# Patient Record
Sex: Female | Born: 1950 | Race: Black or African American | Hispanic: No | Marital: Single | Smoking: Never smoker
Health system: Southern US, Community
[De-identification: ages and names within clinical notes are randomized; demographics above are authoritative.]

## PROBLEM LIST (undated history)

## (undated) DIAGNOSIS — E669 Obesity, unspecified: Secondary | ICD-10-CM

## (undated) DIAGNOSIS — C801 Malignant (primary) neoplasm, unspecified: Secondary | ICD-10-CM

## (undated) HISTORY — DX: Malignant (primary) neoplasm, unspecified: C80.1

## (undated) HISTORY — DX: Obesity, unspecified: E66.9

## (undated) HISTORY — PX: TOE SURGERY: SHX1073

---

## 1998-02-17 ENCOUNTER — Emergency Department (HOSPITAL_COMMUNITY): Admission: EM | Admit: 1998-02-17 | Discharge: 1998-02-17 | Payer: Self-pay | Admitting: Emergency Medicine

## 2010-10-01 ENCOUNTER — Inpatient Hospital Stay (HOSPITAL_COMMUNITY)
Admission: AD | Admit: 2010-10-01 | Discharge: 2010-10-01 | Payer: Self-pay | Source: Home / Self Care | Attending: Obstetrics and Gynecology | Admitting: Obstetrics and Gynecology

## 2010-10-01 LAB — CBC
HCT: 21.1 % — ABNORMAL LOW (ref 36.0–46.0)
Hemoglobin: 6.6 g/dL — CL (ref 12.0–15.0)
MCH: 26 pg (ref 26.0–34.0)
MCHC: 31.3 g/dL (ref 30.0–36.0)
MCV: 83.1 fL (ref 78.0–100.0)
RDW: 14.6 % (ref 11.5–15.5)

## 2010-10-06 ENCOUNTER — Other Ambulatory Visit: Payer: Self-pay | Admitting: Obstetrics & Gynecology

## 2010-10-06 ENCOUNTER — Other Ambulatory Visit (HOSPITAL_COMMUNITY): Admission: RE | Admit: 2010-10-06 | Payer: Self-pay | Source: Ambulatory Visit | Admitting: Obstetrics & Gynecology

## 2010-10-06 ENCOUNTER — Ambulatory Visit: Payer: Self-pay | Admitting: Obstetrics & Gynecology

## 2010-10-06 DIAGNOSIS — N950 Postmenopausal bleeding: Secondary | ICD-10-CM

## 2010-10-17 DIAGNOSIS — Z124 Encounter for screening for malignant neoplasm of cervix: Secondary | ICD-10-CM | POA: Insufficient documentation

## 2010-10-20 ENCOUNTER — Ambulatory Visit: Payer: Self-pay | Admitting: Obstetrics and Gynecology

## 2010-10-20 DIAGNOSIS — N924 Excessive bleeding in the premenopausal period: Secondary | ICD-10-CM

## 2010-10-27 ENCOUNTER — Ambulatory Visit: Payer: Self-pay | Admitting: Obstetrics and Gynecology

## 2010-10-27 DIAGNOSIS — N950 Postmenopausal bleeding: Secondary | ICD-10-CM

## 2010-11-11 ENCOUNTER — Ambulatory Visit: Payer: Self-pay | Admitting: Obstetrics and Gynecology

## 2010-11-18 ENCOUNTER — Ambulatory Visit: Payer: Self-pay | Admitting: Obstetrics and Gynecology

## 2010-11-18 ENCOUNTER — Other Ambulatory Visit (HOSPITAL_COMMUNITY): Admission: RE | Admit: 2010-11-18 | Payer: Self-pay | Source: Ambulatory Visit | Admitting: Obstetrics and Gynecology

## 2010-11-18 ENCOUNTER — Other Ambulatory Visit (HOSPITAL_COMMUNITY)
Admission: RE | Admit: 2010-11-18 | Discharge: 2010-11-18 | Disposition: A | Discharge status: Home / Self Care | Payer: Self-pay | Source: Ambulatory Visit | Attending: Obstetrics and Gynecology | Admitting: Obstetrics and Gynecology

## 2010-11-18 DIAGNOSIS — N950 Postmenopausal bleeding: Secondary | ICD-10-CM | POA: Insufficient documentation

## 2010-11-19 ENCOUNTER — Other Ambulatory Visit: Payer: Self-pay | Admitting: Obstetrics and Gynecology

## 2010-11-26 NOTE — Progress Notes (Unsigned)
NAMEHENRY, [name] NO.:  [id number]  MEDICAL RECORD NO.:  [id number]           PATIENT TYPE:  A  LOCATION:  [facility]                   FACILITY:  [facility]  PHYSICIAN:  Argentina Donovan, MD        DATE OF BIRTH:  [birth year]  DATE OF SERVICE:  10/20/2010                                 CLINIC NOTE  This patient was recently seen because of postmenopausal bleeding.  She is 60 year old African American female nulligravida.  She had been many years without a period and then started bleeding heavily.  She was in the emergency room and had a hemoglobin of 6.2.  She is on iron right now.  She said she felt no dizziness or fainting.  She came in and after was given a pill, 1 tablet of Cytotec.  The uterus is about 10 weeks size, shows a fibroid of 6 x 5 x 6.  The endometrial thickness was 3.7. Of note, the sampler was passed about 8 cm, tissue and blood was obtained.  However, the report showed only endocervical type tissues, no evidence of malignancy.  I am concerned because with a thickened endometrium like that, there was no sign of endometrial tissue, so the patient was then placed on Provera.  However, we are going to stop the Provera, come back in a week.  I am not exactly sure what is going to happen if she starts bleeding again.  We may have to proceed with another biopsy, maybe that there was so much bleeding that when they did the biopsy, they had nothing but blood and clots and not a good endometrial sample and a thickened endometrium may have been a clot.  It is difficult to say, but I am not comfortable with this lady.  Her sister was with her and her sister has had complex endometrial hyperplasia in the past so with that history, family history is the fact that she had some bleeding after many years of not bleeding or bleeding heavily at the age of 45.  I am very concerned about the endometrium.  IMPRESSION:  Postmenopausal bleeding, cause unknown.     ______________________________ Argentina Donovan, MD    PR/MEDQ  D:  10/20/2010  T:  10/21/2010  Job:  [id number]

## 2010-11-26 NOTE — Progress Notes (Unsigned)
NAMESKILER, TYE NO.:  [id number]  MEDICAL RECORD NO.:  [id number]           PATIENT TYPE:  A  LOCATION:  [facility]                   FACILITY:  [facility]  PHYSICIAN:  Argentina Donovan, MD        DATE OF BIRTH:  Dec 11, 1950  DATE OF SERVICE:  11/18/2010                                 CLINIC NOTE  The patient is a 60 year old African American female who on ultrasound had a thickened endometrium and has been bothered by postmenopausal bleeding.  The endometrium thickening is 3.7 cm.  They did a previous endometrial biopsy and that showed no evidence of malignancy, abundant inflammatory cells and blood, and endocervical-type tissue and said the tissue may be from the lower uterine segment, however, they said that the uterus was sounded to 12 cm, so we are repeating the biopsy in view of the fact that I repeated the [facility] and LH and they were in the well above postmenopausal levels.  She is in today.  I have grasped the anterior lip of the cervix which was the clean cervix, but spotting was still occurring with a single-tooth tenaculum.  I sounded the uterus to 12 cm and the biopsy pipette went into a full 12 cm.  Removed the pipette and got a good biopsy specimen, I think, and remove the tenaculum from the cervix.  I will call the patient with the results of this biopsy.  IMPRESSION:  Postmenopausal bleeding with thickened endometrium and postmenopausal hormone levels after a previous biopsy failed to reveal endometrial tissue.          ______________________________ Argentina Donovan, MD    PR/MEDQ  D:  11/18/2010  T:  11/19/2010  Job:  [id number]

## 2010-11-26 NOTE — Progress Notes (Signed)
NAMETUWANDA, VOKES NO.:  [id number]  MEDICAL RECORD NO.:  [id number]           PATIENT TYPE:  A  LOCATION:  [facility]                   FACILITY:  [facility]  PHYSICIAN:  Scheryl Darter, MD       DATE OF BIRTH:  09/01/1951  DATE OF SERVICE:                                 CLINIC NOTE  The patient comes today referred from [facility] due to postmenopausal bleeding.  The patient is a 60 year old black female gravida 0 whose last menstrual period before present episode was several years ago.  She began having bleeding on November 17 which was heavy and then lighter at times.  This continued throughout December.  She presented on January 27 to the [facility]U due to recent episode of heavy bleeding.  She had some slight dizziness but no orthostasis.  She was found to be anemic.  PAST MEDICAL HISTORY:  Unremarkable except for obesity.  PAST SURGICAL HISTORY:  Foot surgery.  SOCIAL HISTORY:  The patient denies smoking or drug abuse.  She occasionally drinks alcohol.  ALLERGIES:  CODEINE which causes nausea.  MEDICATIONS:  Vitamin C supplement 1000 mg a day and iron supplement 325 mg p.o. b.i.d.  REVIEW OF SYSTEMS:  Slight bleeding today.  No complaints of pain.  She had taken Cytotec last night for cervical biopsy and she had considerable amount of cramping and some nausea.  This has resolved.  PHYSICAL EXAMINATION:  GENERAL:  The patient is in no acute distress. She does not appear pale.  Her affect is normal except for slight anxiety. ABDOMEN:  Obese, soft, and nontender with no mass. EXTREMITIES:  Showed no swelling. PELVIC:  External genitalia:  Vagina and cervix showed some slight vaginal bleeding, but otherwise appeared normal.  Uterus is about 8-10 weeks size, nontender.  No adnexal masses.  Ultrasound done on January 27 showed uterus measuring 11.1 x 6.2 x 8.1 cm with fibroids measuring 6.2 x 5.5 and 6.1 x 6.4 cm.  Endometrial thickness was 3.7 cm.  The endometrium  appeared heterogeneous.  The patient gave consent today for endometrial biopsy.  I explained the procedure and risks of uterine damage, bleeding, infection, and pain.  Consent was signed and a time- out was performed.  Cervix was prepped with Betadine and grasped with single-tooth tenaculum.  Cervical dilators were used and uterus sounded to about 12 cm.  Milex sampler was passed to about 8 cm and tissue and blood was obtained.  She tolerated this well.  All instruments were removed.  IMPRESSION:  Postmenopausal bleeding and probable endometrial hyperplasia.  PLAN:  Gave prescription for Provera 10 mg p.o. daily.  She is to return to review her biopsy results in about 3 weeks.  Notify us if her bleeding worsens.  The patient was found to be anemic in MAU and hemoglobin was 6.6.  She should continue with her iron supplementation.     Scheryl Darter, MD    JA/MEDQ  D:  10/06/2010  T:  10/07/2010  Job:  [id number]

## 2010-11-26 NOTE — Progress Notes (Unsigned)
[name], CAPLEY NO.:  [id number]  MEDICAL RECORD NO.:  [id number]           PATIENT TYPE:  A  LOCATION:  [facility]                   FACILITY:  [facility]  PHYSICIAN:  Argentina Donovan, MD        DATE OF BIRTH:  1951-07-03  DATE OF SERVICE:  10/27/2010                                 CLINIC NOTE  The patient is a 60 year old African American, nulligravida female that I saw last week.  She has been bleeding, had a thickened endometrium, had an endometrial biopsy, which showed only endocervical type tissue with abundant inflammatory cells.  No evidence of malignancy seen.  The dictation said that they sounded the uterus to 12 cm.  The Mylex sampler was passed to about 8, where the samples were obtained.  That sounds as if it were well into the endometrial cavity; however, she had been on Provera to stop the bleeding and it did.  I told her when I saw her last week to stop the Provera.  She did and then 2-1/2 days later she started bleeding again.  I am holding off doing anything and see if this runs like a regular period.  I am going to get [facility] and LH levels on her as well as estradiol and she just may be the far end of the bell curve, but I am concerned because she had a sister with complex hyperplasia.  She will come back in a week or two and we will evaluate that after seeing the blood results and decide whether she needs another biopsy and I have told her to take some ibuprofen before coming in.          ______________________________ Argentina Donovan, MD    PR/MEDQ  D:  10/27/2010  T:  10/28/2010  Job:  [id number]

## 2010-12-09 ENCOUNTER — Ambulatory Visit: Payer: Self-pay | Attending: Gynecologic Oncology | Admitting: Gynecologic Oncology

## 2010-12-09 DIAGNOSIS — Z801 Family history of malignant neoplasm of trachea, bronchus and lung: Secondary | ICD-10-CM | POA: Insufficient documentation

## 2010-12-09 DIAGNOSIS — Z8049 Family history of malignant neoplasm of other genital organs: Secondary | ICD-10-CM | POA: Insufficient documentation

## 2010-12-09 DIAGNOSIS — E669 Obesity, unspecified: Secondary | ICD-10-CM | POA: Insufficient documentation

## 2010-12-09 DIAGNOSIS — C549 Malignant neoplasm of corpus uteri, unspecified: Secondary | ICD-10-CM | POA: Insufficient documentation

## 2010-12-10 ENCOUNTER — Other Ambulatory Visit: Payer: Self-pay | Admitting: Obstetrics and Gynecology

## 2010-12-10 DIAGNOSIS — Z1231 Encounter for screening mammogram for malignant neoplasm of breast: Secondary | ICD-10-CM

## 2010-12-14 NOTE — Consult Note (Signed)
NAMESUZZANE, [name] NO.:  [id number]  MEDICAL RECORD NO.:  [id number]           PATIENT TYPE:  LOCATION:                                 FACILITY:  PHYSICIAN:  Laurette Schimke, MD     DATE OF BIRTH:  Jun 09, 1951  DATE OF CONSULTATION:  12/09/2010 DATE OF DISCHARGE:                                CONSULTATION   Visit #[id number].  REFERRING PHYSICIAN:  Dr. Argentina Donovan  REASON FOR CONSULTATION:  Management of a grade 2 endometrial cancer.  HISTORY OF PRESENT ILLNESS:  This is a 60 year old nulliparous female, last normal menstrual period in her 71s.  In November 2011, the patient noted an onset of uterine bleeding, bleeding was continued and she was initially given a prescription for Provera which she took from February 22nd until March 8th.  An endometrial biopsy was attempted, but was unsuccessful because of cervical stenosis.  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  Uterine ultrasound was obtained and demonstrated endometrium of 3.7 cm.  Of note at the time of uterine sampling, the uterus was sounded to 12 cm.  [name] reports continued vaginal spotting.  On December 06, 2010, she noted heavier bleeding and then was normal.  PAST MEDICAL HISTORY:  Obesity.  PAST SURGICAL HISTORY:  Bilateral 5th toe surgery at the age of 13 years.  FAMILY HISTORY:  Notable for father with diabetes mellitus.  Her sister was diagnosed with lung cancer at the age of 45 and a sister was diagnosed with uterine cancer at the age of 103.  PAST GYNECOLOGIC HISTORY:  She is nulliparous, with regular menses. Last Pap 2 years ago within normal limits.  SOCIAL HISTORY:  Tobacco use, denies ever use.  Alcohol use, occasional. 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  ALLERGIES: 1. CODEINE causes emesis. 2. CYTOTEC causes severe emesis.  REVIEW OF  SYSTEMS:  No nausea, vomiting; reports no abdominal bloating. She is unaware of any weight changes.  There is no constipation or diarrhea.  There is continued vaginal spotting with intermittent episodes of heavier bleeding.  PHYSICAL EXAMINATION:  GENERAL:  Well-developed female, in no acute distress. VITAL SIGNS:  Weight 244 pounds, height 5 feet 7 inches, BMI 38.5, blood pressure 128/72, pulse of 72. HEART:  Regular rate and rhythm. CHEST:  Clear to auscultation. BACK:  No CVA tenderness. LYMPH NODE SURVEY:  No cervical, supraclavicular, inguinal adenopathy. ABDOMEN:  Soft, obese, nontender.  No palpable masses.  No palpable omental nodularity. PELVIC:  Normal external genitalia, Bartholin, urethra, and Skene. Blood noted within the vaginal vault, small cervix, very well supported, very wide uterine base approximately 8 cm. RECTAL:  Good anal sphincter tone without any masses.  IMPRESSION:  This is a 60 year old with a grade 2 endometrial adenocarcinoma and evidence of a uterus sounding to 12 cm by Dr. Okey Dupre. Options discussed with the patient of surgical were that of exploratory laparotomy, total abdominal hysterectomy, bilateral salpingo- oophorectomy, pelvic and periaortic lymph node dissection.  Risks and benefits  of the procedure were discussed with the patient and her sisters who were present, all of their questions were answered to their satisfaction.  [name] is aware that procedure will be performed by Dr. De Blanch on Jan 04, 2011.     Laurette Schimke, MD     WB/MEDQ  D:  12/09/2010  T:  12/10/2010  Job:  [id number]  cc:   Dr. Janan Ridge, R.N. [address] [place], [place] [address]  Electronically Signed by Laurette Schimke MD on 12/14/2010 07:07:16 AM

## 2010-12-21 ENCOUNTER — Ambulatory Visit (HOSPITAL_COMMUNITY)
Admission: RE | Admit: 2010-12-21 | Discharge: 2010-12-21 | Disposition: A | Discharge status: Home / Self Care | Payer: Self-pay | Source: Ambulatory Visit | Attending: Obstetrics and Gynecology | Admitting: Obstetrics and Gynecology

## 2010-12-21 DIAGNOSIS — Z1231 Encounter for screening mammogram for malignant neoplasm of breast: Secondary | ICD-10-CM | POA: Insufficient documentation

## 2010-12-31 ENCOUNTER — Other Ambulatory Visit: Payer: Self-pay | Admitting: Obstetrics & Gynecology

## 2010-12-31 ENCOUNTER — Other Ambulatory Visit: Payer: Self-pay | Admitting: Gynecology

## 2010-12-31 ENCOUNTER — Ambulatory Visit (HOSPITAL_COMMUNITY)
Admission: RE | Admit: 2010-12-31 | Discharge: 2010-12-31 | Disposition: A | Discharge status: Home / Self Care | Payer: Self-pay | Source: Ambulatory Visit | Attending: Obstetrics & Gynecology | Admitting: Obstetrics & Gynecology

## 2010-12-31 ENCOUNTER — Encounter (HOSPITAL_COMMUNITY): Payer: Self-pay

## 2010-12-31 DIAGNOSIS — C549 Malignant neoplasm of corpus uteri, unspecified: Secondary | ICD-10-CM | POA: Insufficient documentation

## 2010-12-31 DIAGNOSIS — Z01818 Encounter for other preprocedural examination: Secondary | ICD-10-CM | POA: Insufficient documentation

## 2010-12-31 DIAGNOSIS — R9431 Abnormal electrocardiogram [ECG] [EKG]: Secondary | ICD-10-CM | POA: Insufficient documentation

## 2010-12-31 DIAGNOSIS — Z01812 Encounter for preprocedural laboratory examination: Secondary | ICD-10-CM | POA: Insufficient documentation

## 2010-12-31 DIAGNOSIS — Z01810 Encounter for preprocedural cardiovascular examination: Secondary | ICD-10-CM | POA: Insufficient documentation

## 2010-12-31 LAB — COMPREHENSIVE METABOLIC PANEL
Albumin: 3.9 g/dL (ref 3.5–5.2)
Alkaline Phosphatase: 46 U/L (ref 39–117)
BUN: 11 mg/dL (ref 6–23)
Chloride: 112 mEq/L (ref 96–112)
Creatinine, Ser: 0.99 mg/dL (ref 0.4–1.2)
Glucose, Bld: 101 mg/dL — ABNORMAL HIGH (ref 70–99)
Potassium: 4.4 mEq/L (ref 3.5–5.1)
Total Bilirubin: 0.5 mg/dL (ref 0.3–1.2)
Total Protein: 7.6 g/dL (ref 6.0–8.3)

## 2010-12-31 LAB — CBC
MCH: 26.5 pg (ref 26.0–34.0)
MCHC: 32.3 g/dL (ref 30.0–36.0)
MCV: 82 fL (ref 78.0–100.0)
Platelets: 395 10*3/uL (ref 150–400)
RBC: 4.99 MIL/uL (ref 3.87–5.11)

## 2010-12-31 LAB — TYPE AND SCREEN
ABO/RH(D): A POS
Antibody Screen: NEGATIVE

## 2010-12-31 LAB — DIFFERENTIAL
Basophils Relative: 1 % (ref 0–1)
Eosinophils Absolute: 0.2 10*3/uL (ref 0.0–0.7)
Lymphs Abs: 2.2 10*3/uL (ref 0.7–4.0)
Monocytes Absolute: 0.6 10*3/uL (ref 0.1–1.0)
Monocytes Relative: 13 % — ABNORMAL HIGH (ref 3–12)

## 2010-12-31 LAB — ABO/RH: ABO/RH(D): A POS

## 2011-01-04 ENCOUNTER — Inpatient Hospital Stay (HOSPITAL_COMMUNITY)
Admission: RE | Admit: 2011-01-04 | Discharge: 2011-01-08 | DRG: 741 | Disposition: A | Discharge status: Home / Self Care | Payer: Self-pay | Source: Ambulatory Visit | Attending: Obstetrics & Gynecology | Admitting: Obstetrics & Gynecology

## 2011-01-04 ENCOUNTER — Other Ambulatory Visit: Payer: Self-pay | Admitting: Gynecology

## 2011-01-04 DIAGNOSIS — Z6837 Body mass index (BMI) 37.0-37.9, adult: Secondary | ICD-10-CM

## 2011-01-04 DIAGNOSIS — C801 Malignant (primary) neoplasm, unspecified: Secondary | ICD-10-CM

## 2011-01-04 DIAGNOSIS — R339 Retention of urine, unspecified: Secondary | ICD-10-CM | POA: Diagnosis not present

## 2011-01-04 DIAGNOSIS — C549 Malignant neoplasm of corpus uteri, unspecified: Principal | ICD-10-CM | POA: Diagnosis present

## 2011-01-04 HISTORY — PX: ABDOMINAL HYSTERECTOMY: SHX81

## 2011-01-04 HISTORY — DX: Malignant (primary) neoplasm, unspecified: C80.1

## 2011-01-04 NOTE — Op Note (Signed)
NAMELUMA, CLOPPER               ACCOUNT NO.:  [id number]  MEDICAL RECORD NO.:  [id number]           PATIENT TYPE:  I  LOCATION:  0010                         FACILITY:  [facility]  PHYSICIAN:  De Blanch, M.D.DATE OF BIRTH:  02-24-1951  DATE OF PROCEDURE: DATE OF DISCHARGE:                              OPERATIVE REPORT   PREOPERATIVE DIAGNOSIS:  Grade 2 endometrial adenocarcinoma.  POSTOPERATIVE DIAGNOSIS:  Grade 2 endometrial adenocarcinoma, uterine fibroids.  PROCEDURE:  Total abdominal hysterectomy, bilateral salpingo- oophorectomy, pelvic and paraaortic lymphadenectomy.  SURGEON:  De Blanch, M.D.  ASSISTANTS: 1. Antionette Char MD 2. Telford Nab, R.N.  ANESTHESIA:  General with orotracheal tube.  ESTIMATED BLOOD LOSS:  300 cc.  SURGICAL FINDINGS:  At the time of exploratory laparotomy, the upper abdomen was explored and showed no evidence of metastatic disease. Likewise the pelvic and paraaortic lymph nodes appeared grossly normal. The uterus was enlarged with approximately [redacted] weeks gestational size with multiple subserosal fibroids.  There was a prominent 6-cm paracervical fibroid on the right.  Frozen section revealed inner half myometrial invasion but the lesion measured approximately 5 cm in diameter.  DESCRIPTION OF PROCEDURE:  The patient was brought to the operating room and after satisfactory attainment of general anesthesia was placed in modified lithotomy position in [place] stirrups.  The anterior abdominal wall, perineum and vagina were prepped.  Foley catheter was inserted and the patient was draped.  The abdomen was entered through a midline incision which was extended above the umbilicus.  Once the abdomen was opened, pelvic washings were obtained and sent to cytopathology.  The upper abdomen and pelvis were explored with the above-noted findings.  A Bookwalter retractor was positioned and the small bowel packed out  of pelvis.  The uterus was grasped with long Kelly clamps.  Round ligaments were divided.  The retroperitoneal spaces opened, identifying the iliac vessels, ureter and ovarian vessels.  The ovarian vessels were skeletonized, clamped, cut free, tied and suture ligated.  The bladder flap was advanced through sharp and blunt dissection.  Care was taken to avoid injury to the uterine vessels on the right which were obscured by a large paracervical fibroid.  Once bladder flap was advanced, the left uterine vessels were skeletonized, clamped, cut and suture ligated.  The paracervical tissue was likewise clamped, cut and suture ligated.  Attention was then turned to the right side.  The ureterolysis was required to mobilize the ureter away from the broad ligament fibroid. Once the ureter was fully visualized, we dissected around the fibroid and lifted it up.  We then clamped the uterine vessels.  These were divided and suture ligated.  We proceeded down both sides of the cervix clamping the cardinal ligaments and then the vaginal angles were clamped, the vagina transected from its junction to the cervix.  The vaginal angles were reapproximated with figure-of-eight sutures of 0 Vicryl and the central portion of vagina likewise closed with figure-of- eight sutures of 0 Vicryl.  Attention was turned to performing pelvic lymphadenectomy.  The lymph nodes overlying the external iliac artery and vein, internal iliac artery and obturator fossa were excised.  This was performed on both sides of the pelvis.  Care was taken to avoid injury to the obturator and genitofemoral nerve and blood vessels.  The Bookwalter retractor was repositioned and the paraaortic region exposed.  An incision was made along the peritoneum overlying the right common iliac artery and along the aorta.  The right ureter was deflected laterally behind the retractor.  We dissected underneath the retroperitoneal portion of the  duodenum.  With this exposure, we then excised all lymph nodes overlying the aorta, vena cava and right common iliac artery.  Hemostasis was achieved with cautery and occasional hemoclip.  This area was found to be dry.  We then returned to the pelvis.  The pelvis was inspected and additional suture was taken in the vaginal angle to achieve hemostasis.  Pelvis was irrigated and found to be hemostatic.  Packs and retractors were removed.  The anterior abdominal wall was then closed in layers, the first being a running mass closure using #1 PDS.  A #19 Blake drain was placed above the fascia in order to reduce the risk of seroma formation.  This was exited through stab wound in left upper quadrant and secured to the skin with 3-0 nylon.  The subcutaneous tissue was reapproximated with interrupted sutures of 3-0 Vicryl and the skin was closed with skin staples.  A dressing was applied.  The patient was awakened from anesthesia and taken to the recovery room in satisfactory condition.  Sponge, needle and instrument counts were correct x2.     De Blanch, M.D.     DC/MEDQ  D:  01/04/2011  T:  01/04/2011  Job:  [id number]  cc:   Michele Mcalpine D. Okey Dupre, M.D.  Telford Nab, R.N. [address] [place], [place] [address]  Roseanna Rainbow, M.D. Fax: [phone]  Electronically Signed by De Blanch M.D. on 01/04/2011 03:55:10 PM

## 2011-01-05 LAB — CBC
HCT: 32.6 % — ABNORMAL LOW (ref 36.0–46.0)
MCHC: 31.3 g/dL (ref 30.0–36.0)
MCV: 83.8 fL (ref 78.0–100.0)
Platelets: 344 10*3/uL (ref 150–400)
RDW: 15.5 % (ref 11.5–15.5)

## 2011-01-05 LAB — BASIC METABOLIC PANEL
BUN: 9 mg/dL (ref 6–23)
Calcium: 8.3 mg/dL — ABNORMAL LOW (ref 8.4–10.5)
Creatinine, Ser: 0.81 mg/dL (ref 0.4–1.2)
GFR calc non Af Amer: >60 mL/min (ref >60)
Glucose, Bld: 133 mg/dL — ABNORMAL HIGH (ref 70–99)

## 2011-01-06 LAB — URINALYSIS, ROUTINE W REFLEX MICROSCOPIC
Bilirubin Urine: NEGATIVE
Ketones, ur: NEGATIVE mg/dL
Nitrite: NEGATIVE
Protein, ur: NEGATIVE mg/dL
Urobilinogen, UA: 0.2 mg/dL (ref 0.0–1.0)

## 2011-01-06 LAB — URINE MICROSCOPIC-ADD ON

## 2011-01-09 ENCOUNTER — Inpatient Hospital Stay (HOSPITAL_COMMUNITY): Payer: Self-pay

## 2011-01-09 ENCOUNTER — Inpatient Hospital Stay (HOSPITAL_COMMUNITY)
Admission: AD | Admit: 2011-01-09 | Discharge: 2011-01-13 | DRG: 394 | Disposition: A | Discharge status: Home / Self Care | Payer: Self-pay | Source: Ambulatory Visit | Attending: Obstetrics & Gynecology | Admitting: Obstetrics & Gynecology

## 2011-01-09 DIAGNOSIS — Y838 Other surgical procedures as the cause of abnormal reaction of the patient, or of later complication, without mention of misadventure at the time of the procedure: Secondary | ICD-10-CM | POA: Diagnosis present

## 2011-01-09 DIAGNOSIS — K560 Paralytic ileus: Secondary | ICD-10-CM | POA: Diagnosis present

## 2011-01-09 DIAGNOSIS — K929 Disease of digestive system, unspecified: Principal | ICD-10-CM | POA: Diagnosis present

## 2011-01-09 LAB — DIFFERENTIAL
Basophils Relative: 0 % (ref 0–1)
Eosinophils Relative: 1 % (ref 0–5)
Monocytes Absolute: 0.8 10*3/uL (ref 0.1–1.0)
Monocytes Relative: 11 % (ref 3–12)
Neutro Abs: 5.4 10*3/uL (ref 1.7–7.7)

## 2011-01-09 LAB — URINE CULTURE
Colony Count: >=100000
Culture  Setup Time: 201205040125
Special Requests: NEGATIVE

## 2011-01-09 LAB — COMPREHENSIVE METABOLIC PANEL
Albumin: 3 g/dL — ABNORMAL LOW (ref 3.5–5.2)
Alkaline Phosphatase: 87 U/L (ref 39–117)
BUN: 10 mg/dL (ref 6–23)
Chloride: 101 mEq/L (ref 96–112)
Creatinine, Ser: 0.84 mg/dL (ref 0.4–1.2)
Glucose, Bld: 121 mg/dL — ABNORMAL HIGH (ref 70–99)
Potassium: 3.9 mEq/L (ref 3.5–5.1)
Total Bilirubin: 0.6 mg/dL (ref 0.3–1.2)
Total Protein: 6.7 g/dL (ref 6.0–8.3)

## 2011-01-09 LAB — CBC
HCT: 33.8 % — ABNORMAL LOW (ref 36.0–46.0)
Hemoglobin: 10.9 g/dL — ABNORMAL LOW (ref 12.0–15.0)
MCH: 26.8 pg (ref 26.0–34.0)
MCHC: 32.2 g/dL (ref 30.0–36.0)
RDW: 15.5 % (ref 11.5–15.5)

## 2011-01-09 MED ORDER — IOHEXOL 300 MG/ML  SOLN
100.0000 mL | Freq: Once | INTRAMUSCULAR | Status: AC | PRN
  Administered 2011-01-09: 100 mL via INTRAVENOUS

## 2011-01-12 LAB — BASIC METABOLIC PANEL
Calcium: 8.9 mg/dL (ref 8.4–10.5)
GFR calc Af Amer: >60 mL/min (ref >60)
GFR calc non Af Amer: >60 mL/min (ref >60)
Glucose, Bld: 81 mg/dL (ref 70–99)
Potassium: 4.1 mEq/L (ref 3.5–5.1)
Sodium: 134 mEq/L — ABNORMAL LOW (ref 135–145)

## 2011-01-15 NOTE — Discharge Summary (Signed)
  [name], [name]               ACCOUNT NO.:  [id number]  MEDICAL RECORD NO.:  [id number]           PATIENT TYPE:  I  LOCATION:  1530                         FACILITY:  [facility]  PHYSICIAN:  Roseanna Rainbow, M.D.DATE OF BIRTH:  02/10/1951  DATE OF ADMISSION:  01/04/2011 DATE OF DISCHARGE:  01/08/2011                              DISCHARGE SUMMARY   CHIEF COMPLAINT:  The patient is a 60 year old who presents for operative management of a grade 2 endometrial cancer.  Please see the dictated history and physical per Dr. Laurette Schimke.  HOSPITAL COURSE:  The patient was admitted and underwent a total abdominal hysterectomy, bilateral salpingo-oophorectomy, pelvic and periaortic lymphadenectomy.  Please see the dictated operative summary for details.  The patient was started on Lovenox postoperatively for thromboembolic prophylaxis.  Her hemoglobin on postoperative day #1 was 10.2.  A basic metabolic profile was normal.  The patient's diet was gradually advanced.  On postoperative day #2, there was urinary retention versus mild dehydration, hypovolemia.  The patient increased her fluid intake and the Foley catheter was replaced for 24 hours.  A urinalysis was negative.  A urine culture and sensitivity was pending on the day of discharge.  The Foley catheter was again discontinued on postoperative day #3.  The patient voided without difficulty.  The JP drain that had been placed in the subcutaneous layer was removed on the day of discharge.  DISCHARGE DIAGNOSIS:  Endometrioid carcinoma FIGO grade 3, stage IA.  PROCEDURES:  Total abdominal hysterectomy, bilateral salpingo- oophorectomy, pelvic and periaortic lymphadenectomies.  CONDITION:  Stable.  DIET:  Regular.  ACTIVITY:  Pelvic rest, progressive activity.  MEDICATIONS:  Herbal liquid, ferrous sulfate, ibuprofen, Percocet 5/325 one to two tablets every 6 hours as needed, Lovenox 50 mg subcu q.24 h for an 18-day  course.  DISPOSITION:  The patient was to follow up in the GYN Oncology office for staple removal and routine postoperative followup.     Roseanna Rainbow, M.D.     LAJ/MEDQ  D:  01/08/2011  T:  01/08/2011  Job:  [id number]  cc:   Telford Nab, R.N. [address] [place], [place] [address]  Dr. Argentina Donovan  Electronically Signed by Antionette Char M.D. on 01/15/2011 12:17:09 PM

## 2011-02-02 ENCOUNTER — Ambulatory Visit: Payer: Self-pay | Attending: Gynecologic Oncology | Admitting: Gynecologic Oncology

## 2011-02-02 DIAGNOSIS — R209 Unspecified disturbances of skin sensation: Secondary | ICD-10-CM | POA: Insufficient documentation

## 2011-02-02 DIAGNOSIS — Z9079 Acquired absence of other genital organ(s): Secondary | ICD-10-CM | POA: Insufficient documentation

## 2011-02-02 DIAGNOSIS — R110 Nausea: Secondary | ICD-10-CM | POA: Insufficient documentation

## 2011-02-02 DIAGNOSIS — Z90710 Acquired absence of both cervix and uterus: Secondary | ICD-10-CM | POA: Insufficient documentation

## 2011-02-02 DIAGNOSIS — C549 Malignant neoplasm of corpus uteri, unspecified: Secondary | ICD-10-CM | POA: Insufficient documentation

## 2011-02-03 NOTE — Consult Note (Signed)
NAMEZOOEY, SCHREURS               ACCOUNT NO.:  [id number]  MEDICAL RECORD NO.:  [id number]           PATIENT TYPE:  O  LOCATION:  GYN                          FACILITY:  [facility]  PHYSICIAN:  Gwynneth Fabio A. Duard Brady, MD    DATE OF BIRTH:  August 03, 1951  DATE OF CONSULTATION:  02/02/2011 DATE OF DISCHARGE:                                CONSULTATION   HISTORY OF PRESENT ILLNESS:  Ms. Cuppett is a very pleasant 60 year old who was seen by Dr. Nelly Rout who was diagnosed with a grade 2 endometrioid adenocarcinoma.  Subsequently, on Jan 04, 2011, she underwent total abdominal hysterectomy, bilateral salpingo-oophorectomy, pelvic and paraaortic lymph node dissection.  Surgical findings revealed the uterus to be enlarged at approximately 14 weeks' size with multiple subserosal fibroids.  There was a prominent 6 cm paracervical fibroid on the right side.  Frozen section revealed inner half myometrial invasion, but the lesion measured 5 cm in diameter.  Final pathology revealed a grade 3 endometrioid adenocarcinoma confined within the inner half of the myometrium.  There was adenomyosis within the uterus.  The bilateral adnexa were negative.  0 out of 8 left pelvic lymph nodes, 0 out of 14 right pelvic lymph nodes, and 0 out of 7 right paraaortic lymph nodes were involved for a total of 0 out of 29.  The depth of invasion was 0.2 cm, the myometrium was 2.6 cm in thickness.  The tumor size did measure 5 cm.  Based on this pathology report, she is IA grade 3 and does not qualifies high-intermediate risk, she only has grade 3 lesion as her risk factor.  She has minimal myometrial invasion, no lymphovascular space involvement with an age of 60.  She comes in today for postoperative check.  She was admitted postoperatively for a couple days for an ileus versus partial small bowel obstruction.  She does not recall how many days she was admitted for.  She was admitted to [facility] and was under the care of  Dr. Tamela Oddi, she recovered fine since that time.  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  She does complain of occasional right lower quadrant discomfort when she is getting up.  She is walking every morning, she denies any vaginal bleeding, any change in bowel or bladder habits.  She states she has only taken 6 out of the 30 pain pills that were prescribed to her, but she did take one last night, anxiety in anticipation of today's visit.  PHYSICAL EXAMINATION:  VITAL SIGNS:  Weight 228 pounds, which is down 16 pounds from April 5th.  Blood pressure 116/70, pulse 68, respirations 18, temperature 97.7. GENERAL:  A well-nourished, well-developed female, in no acute distress. ABDOMEN:  A well-healed vertical midline incision.  Abdomen is soft and nontender. PELVIC:  External genitalia is within normal limits, though atrophic. The vagina is without any lesions.  The vaginal cuff is visualized.  It is healing well.  Bimanual examination reveals no masses or tenderness.  ASSESSMENT:  A 60 year old with stage IA, grade 3, endometrioid adenocarcinoma who is doing well from a postoperative standpoint.  She does not require any adjuvant therapy.  She and her sisters are very pleased regarding this.  We had a very lengthy discussion regarding the numbness on the right anterior thigh in that I feel this is consistent with genitofemoral as she does not have any motor deficits.  I discussed with her that this is self-limited, will improve with time.  She is doing very well with a walking regimen and is going upstairs.  She also complained of having some nausea when she smells very strong smelling foods that are being prepared in her home, this is most likely as she had had episodes of nausea at the time of her partial small bowel obstruction versus ileus and she is associated  certain smells with that. I have encouraged her to not let that become a pattern and to either cook foods in a different way, so that the odors are not as bad or to open windows or increase ventilation if at all possible.  She will return to see [place] in 3 months for an interval visit and they will begin alternating visits every 4 months with Dr. Okey Dupre.  Her questions were elicited and answered to her satisfaction.  We spent a total of 20 minutes face-to-face time discussing her many questions.     Khandi Kernes A. Duard Brady, MD     PAG/MEDQ  D:  02/02/2011  T:  02/03/2011  Job:  [id number]  cc:   Javier Glazier. Okey Dupre, M.D.  Telford Nab, R.N. [address] [place], [place] [address]  Electronically Signed by Cleda Mccreedy MD on 02/03/2011 09:38:00 AM

## 2011-02-10 ENCOUNTER — Ambulatory Visit: Payer: Self-pay | Attending: Gynecologic Oncology | Admitting: Gynecologic Oncology

## 2011-02-10 DIAGNOSIS — Z90710 Acquired absence of both cervix and uterus: Secondary | ICD-10-CM | POA: Insufficient documentation

## 2011-02-10 DIAGNOSIS — Z9079 Acquired absence of other genital organ(s): Secondary | ICD-10-CM | POA: Insufficient documentation

## 2011-02-10 DIAGNOSIS — R1110 Vomiting, unspecified: Secondary | ICD-10-CM | POA: Insufficient documentation

## 2011-02-10 DIAGNOSIS — N898 Other specified noninflammatory disorders of vagina: Secondary | ICD-10-CM | POA: Insufficient documentation

## 2011-02-10 DIAGNOSIS — C549 Malignant neoplasm of corpus uteri, unspecified: Secondary | ICD-10-CM | POA: Insufficient documentation

## 2011-02-11 NOTE — Consult Note (Signed)
NAMESOLVEIG, FANGMAN NO.:  [id number]  MEDICAL RECORD NO.:  [id number]  LOCATION:  GYN                          FACILITY:  [facility]  PHYSICIAN:  Laurette Schimke, MD     DATE OF BIRTH:  1951-07-25  DATE OF CONSULTATION: DATE OF DISCHARGE:                                CONSULTATION   REASON FOR VISIT:  Vaginal discharge, status post endometrial cancer staging.  HISTORY OF PRESENT ILLNESS:  This is a 60 year old, diagnosed with a grade 2 endometrial carcinoma by Dr. Argentina Donovan.  On Jan 04, 2011, she underwent total abdominal hysterectomy, bilateral salpingo-oophorectomy, pelvic and periaortic lymph node dissection.  Surgical findings noted the uterus, measuring approximately 14 cm with multiple subserosal fibroids.  There was a 6 cm paracervical fibroid on the right.  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  There is no lymphovascular space involvement or no cervical involvement.  The depth of invasion was 2 mm where the myometrium was 26 mm in thickness.  Postoperatively, she required readmission for an ileus versus partial bowel obstruction, and also had complaints of right lower discomfort.  Ms. Wirtanen reports that 2 days ago on February 09, 2011, she noticed leakage of clear liquids, which had the odor of that of urine.  She states that the leakage is not continuous, but almost in gushes.  She reports having a large bladder void in the morning.  She denies any recent episodes of significant coughing.  She denies leakage with coughing or sneezing. She also denies any recent heavy lifting, abdominal strain, and she reports that she has had pelvic rest.  PHYSICAL EXAMINATION:  VITAL SIGNS:  Blood pressure 120/68, pulse of 64, temperature 98.2. GENERAL:  A well-developed female in no acute distress. ABDOMEN:  A  well-healed midline vertical incision.  Abdomen is soft, nontender.  No evidence of hernia. PELVIC:  Fluid was appreciated within the vagina without pooling.  No vaginal discharge.  Vaginal cuff appeared intact.  There is no evidence of bowel within the vault or the vaginal apex. RECTAL:  Good anal sphincter tone without any masses.  IMPRESSION:  This is a 60 year old with a stage IA, grade 3, endometrioid adenocarcinoma with clear vaginal discharge.  A tampon test was done on this patient.  Tampon was inserted into the vagina and methylene blue instilled into the bladder.  The patient sat, ambulated without staining of the tampon.  She was then instructed to void and a dose of Pyridium was given.  The patient then subsequently voided orange urine and there still with no evidence of pooling of orange material within the vagina and the tampon was without any evidence of either orange or blue staining.  Of note, the tampon was soaked with a serum colored liquid.  It is my suspicion that this is peritoneal fluid, I am unclear whether she had a superior vaginal seroma that is now leaking or whether this represents disruption of the vaginal repair with subsequent leakage of peritoneal fluid.  I have instructed the patient to continue pelvic rest.  No  heavy lifting and I have advised her that the symptoms should resolve.  If there is increase in leakage of urine or she has been instructed if there is a feeling of fullness or mass within the vagina, then she should present to [facility].  She has also been advised to call for bleeding, increasing discharge, or other concerns.  She will follow up in 4 days with a phone call for a checkup and will be reassessed at that point.  Otherwise, the plan is for her to be seen in her 6-week visit. The patient and her sisters clearly understood the instructions.     Laurette Schimke, MD     WB/MEDQ  D:  02/10/2011  T:  02/11/2011  Job:   [id number]  cc:   Elinor Dodge, MD   Telford Nab, R.N. [address] [place], [place] [address]  Electronically Signed by Laurette Schimke MD on 02/11/2011 10:21:12 AM

## 2011-02-18 NOTE — Discharge Summary (Signed)
  [name], [name] NO.:  [id number]  MEDICAL RECORD NO.:  [id number]  LOCATION:  GYN                          FACILITY:  [facility]  PHYSICIAN:  Roseanna Rainbow, M.D.DATE OF BIRTH:  [birth year]  DATE OF ADMISSION:  02/10/2011 DATE OF DISCHARGE:                              DISCHARGE SUMMARY   CHIEF COMPLAINT:  The patient is a 60 year old, status post a total abdominal hysterectomy and bilateral salpingo-oophorectomy on May 1 for an endometrial cancer, complaining of vomiting.  HISTORY OF PRESENT ILLNESS:  The patient reports the above complaints. She denies the use of bowel movement and has not passed flatus since being discharged from the hospital.  SOCIAL HISTORY:  No tobacco, ethanol or drug use.  ALLERGIES:  No known drug allergies.  MEDICATIONS:  Please see the medication reconciliation form.  REVIEW OF SYSTEMS:  GI:  Please see the above.  She denies severe focal abdominal pain.  GENERAL:  She denies any fever.  PHYSICAL EXAMINATION:  VITAL SIGNS:  Stable, afebrile. GENERAL:  No apparent distress. ABDOMEN:  Mildly distended, hypoactive bowel sounds throughout and nontender.  ASSESSMENT AND PLAN:  Likely postoperative ileus.  KUB and upright, serial labs, serial exams and bowel rest.    HOSPITAL COURSE:  The patient was admitted and and an NG tube was placed.  The patient remained afebrile on Jan 12, 2011.  The patient reported flatus and the NG tube was clamped.  The patient tolerated clamping of the NG tube.  The NG tube was then removed.  The patient was started on clear liquid diet.  An H2 blocker was started as well.  A white blood cell count was 7.4 on Jan 09, 2011, hemoglobin was 10.9 and 33.8.  Basic metabolic profile was normal.  On the two-view abdomen on Jan 09, 2011, favored in early of partial small-bowel obstruction.  A subsequent CT scan dated the same day showed a small-bowel obstruction localized to the right lower quadrant  ileal loop.  No evidence of abscess and postoperative changes.  The patient was tolerating a transitional diet on the day of discharge.  DISCHARGE DIAGNOSIS:  Postoperative ileus versus early partial small- bowel obstruction.  CONDITION:  Stable.  DIET:  Regular diet.  DISCHARGE MEDICATIONS: 1. Percocet 5/325 1-2 tabs as needed. 2. Lovenox 60 mg daily. 3. Vitamin C. 4. Ibuprofen p.r.n. 5. Ferrous sulfate 2 tabs daily.  DISPOSITION:  The patient was to follow up in the GYN Oncology office.  ACTIVITY:  Ad lib.     Roseanna Rainbow, M.D.     LAJ/MEDQ  D:  02/14/2011  T:  02/15/2011  Job:  [id number]  cc:   Telford Nab, R.N. [address] [place], [place] [address]  Electronically Signed by Antionette Char M.D. on 02/18/2011 04:59:12 PM

## 2011-03-11 ENCOUNTER — Ambulatory Visit: Payer: Self-pay | Attending: Gynecology | Admitting: Gynecology

## 2011-03-11 DIAGNOSIS — C549 Malignant neoplasm of corpus uteri, unspecified: Secondary | ICD-10-CM | POA: Insufficient documentation

## 2011-03-11 DIAGNOSIS — R209 Unspecified disturbances of skin sensation: Secondary | ICD-10-CM | POA: Insufficient documentation

## 2011-03-15 NOTE — Consult Note (Signed)
  [name], CUADRA NO.:  [id number]  MEDICAL RECORD NO.:  [id number]  LOCATION:  GYN                          FACILITY:  [facility]  PHYSICIAN:  De Blanch, M.D.DATE OF BIRTH:  05/29/1951  DATE OF CONSULTATION:  03/11/2011 DATE OF DISCHARGE:                                CONSULTATION   CHIEF COMPLAINT:  Endometrial cancer.  INTERVAL HISTORY:  The patient returns today for continuing followup. She was last seen on June 7 at which time she had a vaginal discharge. The discharge has completely resolved as of February 28, 2011, according to the patient.  She denies any vaginal bleeding.  She denies any pelvic pain, pressure or any other GI or GU symptoms.  She does note some loss of sensation in her right anterior thigh, although she has full range of motion and full function.  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  HISTORY OF PRESENT ILLNESS:  The patient underwent a TAH-BSO, pelvic and paraaortic lymphadenectomy on Jan 04, 2011.  Final pathology showed a grade 3 endometrial cancer with superficial myometrial invasion (stage IA).  No adjuvant therapy has been recommended.  Overall, the patient is doing quite well and has no complaints.  She is eager to return to work full-time.  PAST MEDICAL HISTORY:  Medical illnesses, obesity.  PAST SURGICAL HISTORY:  Toe surgery, TAH-BSO, pelvic and paraaortic lymphadenectomy.  PAST GYNECOLOGIC HISTORY:  Gravida 0.  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  She does not smoke.  DRUG ALLERGIES:  CODEINE (nausea), CYTOTEC (nausea).  FAMILY HISTORY:  The patient has a sister who has endometrial cancer. There is no other breast or colon cancer in the family history.  REVIEW OF SYSTEMS:  Ten-point comprehensive review of systems negative except as noted above.  PHYSICAL EXAM:  VITAL SIGNS:  Weight 228 pounds, blood pressure 130/70. GENERAL:  The patient is  a healthy African-American female, in no acute distress. HEENT:  Negative. NECK:  Supple without thyromegaly.  There is no supraclavicular or inguinal adenopathy. ABDOMEN:  Soft, nontender.  No mass, organomegaly, ascites or hernias noted.  Midline incision is healing well. PELVIC EXAM:  EGBUS, vagina, bladder, urethra are normal.  Cervix and uterus surgically absent.  Vaginal cuff is well healed.  No lesions are noted.  Bimanual, rectovaginal exam revealed no mass, induration or nodularity.  IMPRESSION:  Stage IA grade 3 endometrial cancer, doing well.  The patient given the okay to return to full levels of activity.  She will return to see Dr. Okey Dupre in 3 months for surveillance and we will plan on seeing her back in 6 months.     De Blanch, M.D.     DC/MEDQ  D:  03/11/2011  T:  03/11/2011  Job:  [id number]  cc:   Michele Mcalpine D. Okey Dupre, M.D.  Telford Nab, R.N. [address] [place], [place] [address]  Electronically Signed by De Blanch M.D. on 03/15/2011 02:06:40 PM

## 2011-04-22 ENCOUNTER — Ambulatory Visit: Payer: Self-pay | Attending: Gynecology | Admitting: Gynecology

## 2011-04-22 DIAGNOSIS — R209 Unspecified disturbances of skin sensation: Secondary | ICD-10-CM | POA: Insufficient documentation

## 2011-04-22 DIAGNOSIS — R29898 Other symptoms and signs involving the musculoskeletal system: Secondary | ICD-10-CM | POA: Insufficient documentation

## 2011-04-25 NOTE — Consult Note (Signed)
  NAMEBAYA, LENTZ NO.:  [id number]  MEDICAL RECORD NO.:  [id number]  LOCATION:  GYN                          FACILITY:  [facility]  PHYSICIAN:  De Blanch, M.D.DATE OF BIRTH:  10-29-1950  DATE OF CONSULTATION:  04/22/2011 DATE OF DISCHARGE:                                CONSULTATION   CHIEF COMPLAINT:  Weakness and numbness in her left thigh.  The patient returns today to further discuss the numbness in her right thigh.  This was previously brought to my attention and it was my impression at that time that it was a genitofemoral nerve injury from a lymph node dissection.  However, the patient tells me that she is unable to lift her leg from the gas pedal to the brake pedal when she drives her car. She says that her left leg is weaker; although, she did not tell me this at our last visit.  PHYSICAL EXAMINATION:  VITAL SIGNS:  Weight 226 pounds, blood pressure 120/78. GENERAL:  The patient is a healthy, alert, African American female. EXTREMITIES:  Examination of her legs reveals that she does have quadriceps function in her right leg, but that it is diminished compared to the left leg.  She also notes some numbness in the anterior aspect of the right leg.  She is able to dorsiflex and does not have footdrop.  IMPRESSION:  Most likely a transient femoral nerve injury, possibly secondary to retractor placement.  The patient does have quadriceps function and I think this is a partial injury which should resolve over time.  In the interim, I think it is important for her to enter a physical therapy program in order to keep as much strength and range of motion in the leg as possible.  For the time being, she will not drive until she can do this safely.  I will plan on seeing the patient back again in 2 months for reevaluation or as needed.     De Blanch, M.D.     DC/MEDQ  D:  04/22/2011  T:  04/23/2011  Job:  [id number]  cc:   Telford Nab, R.N. [address] [place], [place] [address]  Javier Glazier. Okey Dupre, M.D.  Electronically Signed by De Blanch M.D. on 04/25/2011 11:40:26 AM

## 2011-04-26 ENCOUNTER — Ambulatory Visit: Payer: Self-pay | Attending: Gynecology | Admitting: Physical Therapy

## 2011-04-26 DIAGNOSIS — M6281 Muscle weakness (generalized): Secondary | ICD-10-CM | POA: Insufficient documentation

## 2011-04-26 DIAGNOSIS — IMO0001 Reserved for inherently not codable concepts without codable children: Secondary | ICD-10-CM | POA: Insufficient documentation

## 2011-04-27 ENCOUNTER — Ambulatory Visit: Payer: Self-pay

## 2011-04-27 ENCOUNTER — Ambulatory Visit: Payer: Self-pay | Admitting: Gynecologic Oncology

## 2011-05-02 ENCOUNTER — Ambulatory Visit: Payer: Self-pay | Admitting: Physical Therapy

## 2011-05-04 ENCOUNTER — Encounter: Payer: Self-pay | Admitting: Physical Therapy

## 2011-05-04 ENCOUNTER — Ambulatory Visit: Payer: Self-pay | Admitting: Physical Therapy

## 2011-05-11 ENCOUNTER — Ambulatory Visit: Payer: Self-pay | Attending: Gynecology | Admitting: Physical Therapy

## 2011-05-11 DIAGNOSIS — M6281 Muscle weakness (generalized): Secondary | ICD-10-CM | POA: Insufficient documentation

## 2011-05-11 DIAGNOSIS — IMO0001 Reserved for inherently not codable concepts without codable children: Secondary | ICD-10-CM | POA: Insufficient documentation

## 2011-05-16 ENCOUNTER — Ambulatory Visit: Payer: Self-pay | Admitting: Physical Therapy

## 2011-05-18 ENCOUNTER — Ambulatory Visit: Payer: Self-pay | Admitting: Physical Therapy

## 2011-05-23 ENCOUNTER — Ambulatory Visit: Payer: Self-pay | Admitting: Physical Therapy

## 2011-05-26 ENCOUNTER — Encounter: Payer: Self-pay | Admitting: Physical Therapy

## 2011-05-26 ENCOUNTER — Ambulatory Visit: Payer: Self-pay | Admitting: Rehabilitative and Restorative Service Providers"

## 2011-05-30 ENCOUNTER — Ambulatory Visit: Payer: Self-pay | Admitting: Physical Therapy

## 2011-06-01 ENCOUNTER — Ambulatory Visit: Payer: Self-pay | Admitting: Physical Therapy

## 2011-06-06 ENCOUNTER — Ambulatory Visit: Payer: Self-pay | Attending: Gynecology | Admitting: Physical Therapy

## 2011-06-06 DIAGNOSIS — M6281 Muscle weakness (generalized): Secondary | ICD-10-CM | POA: Insufficient documentation

## 2011-06-06 DIAGNOSIS — IMO0001 Reserved for inherently not codable concepts without codable children: Secondary | ICD-10-CM | POA: Insufficient documentation

## 2011-06-10 ENCOUNTER — Ambulatory Visit: Payer: Self-pay | Attending: Gynecology | Admitting: Gynecology

## 2011-06-10 ENCOUNTER — Other Ambulatory Visit (HOSPITAL_COMMUNITY)
Admission: RE | Admit: 2011-06-10 | Discharge: 2011-06-10 | Disposition: A | Discharge status: Home / Self Care | Payer: Self-pay | Source: Ambulatory Visit | Attending: Gynecology | Admitting: Gynecology

## 2011-06-10 ENCOUNTER — Other Ambulatory Visit: Payer: Self-pay | Admitting: Gynecology

## 2011-06-10 DIAGNOSIS — C549 Malignant neoplasm of corpus uteri, unspecified: Secondary | ICD-10-CM | POA: Insufficient documentation

## 2011-06-10 DIAGNOSIS — R29898 Other symptoms and signs involving the musculoskeletal system: Secondary | ICD-10-CM | POA: Insufficient documentation

## 2011-06-10 DIAGNOSIS — Z8540 Personal history of malignant neoplasm of unspecified female genital organ: Secondary | ICD-10-CM | POA: Insufficient documentation

## 2011-06-10 DIAGNOSIS — G5720 Lesion of femoral nerve, unspecified lower limb: Secondary | ICD-10-CM | POA: Insufficient documentation

## 2011-06-13 ENCOUNTER — Ambulatory Visit: Payer: Self-pay | Admitting: Physical Therapy

## 2011-06-13 NOTE — Consult Note (Signed)
  NAMERYANNE, MORAND NO.:  [id number]  MEDICAL RECORD NO.:  [id number]  LOCATION:  GYN                          FACILITY:  [facility]  PHYSICIAN:  De Blanch, M.D.DATE OF BIRTH:  [birth year]  DATE OF CONSULTATION:  06/10/2011 DATE OF DISCHARGE:                                CONSULTATION   CHIEF COMPLAINT:  Endometrial cancer, weakness of right leg.  INTERVAL HISTORY:  The patient returns today for a routine surveillance test, previously scheduled.  From a gynecologic point of view, she has no GI or GU symptoms.  Has no pelvic pain, pressure, vaginal bleeding, or discharge.  The patient is currently undergoing physical therapy twice a week and is noting continued improvement in the strength and function of her right leg.  She still feels that she is not strong enough to be able to lift her leg on to the brake pedal.  She is able to walk and takes a bus to and from her activities.  HISTORY OF PRESENT ILLNESS:  The patient underwent TAH-BSO, pelvic and periaortic lymphadenectomy Jan 04, 2011.  Final pathology showed a grade 3 endometrial cancer with superficial myometrial invasion (stage IA grade 3).  PAST MEDICAL HISTORY:  MEDICAL ILLNESSES:  Obesity.  PAST SURGICAL HISTORY:  The patient had surgery on her toe, TAH-BSO, pelvic and periaortic lymphadenectomy.  PAST GYNECOLOGIC HISTORY:  Gravida 0.  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  DRUG ALLERGIES:  CODEINE and CYTOTEC.  FAMILY HISTORY:  A sister with endometrial cancer.  REVIEW OF SYSTEMS:  10-point comprehensive review of systems negative except as noted above.  PHYSICAL EXAMINATION:  VITAL SIGNS:  Weight 228 pounds, blood pressure 130/80, pulse 70, respiratory rate 18. GENERAL:  The patient is a healthy African American female.  No acute distress. HEENT:  Negative. NECK:  Supple without thyromegaly.  There is no  supraclavicular or inguinal adenopathy. ABDOMEN:  Obese, soft, nontender.  No mass, organomegaly, ascites or hernias noted.  Midline incision is well-healed. PELVIC:  EG/BUS, vagina, bladder, urethra are normal.  Cervix and uterus surgically absent.  Cuff is well-supported.  No lesions are noted. Bimanual and rectovaginal exam revealed no masses, induration, or nodularity. EXTREMITIES:  Lower extremities are without edema or varicosities and grossly appear to have good strength in both legs.  IMPRESSION:  Stage IA, grade 3 endometrial cancer, clinically free of disease.  Pap smears were obtained.  Apparently femoral nerve compression injury, which is improving with physical therapy.  PLAN:  We will have the patient return in 3 months for continuing surveillance and she will continue physical therapy.     De Blanch, M.D.     DC/MEDQ  D:  06/10/2011  T:  06/10/2011  Job:  [id number]  cc:   Michele Mcalpine D. Okey Dupre, M.D.  Telford Nab, R.N. [address] [place], [place] [address]  Electronically Signed by De Blanch M.D. on 06/13/2011 09:17:26 AM

## 2011-06-15 ENCOUNTER — Ambulatory Visit: Payer: Self-pay | Admitting: Physical Therapy

## 2011-06-20 ENCOUNTER — Ambulatory Visit: Payer: Self-pay | Admitting: Physical Therapy

## 2011-06-22 ENCOUNTER — Encounter: Payer: Self-pay | Admitting: Physical Therapy

## 2011-06-27 ENCOUNTER — Encounter: Payer: Self-pay | Admitting: Physical Therapy

## 2011-06-29 ENCOUNTER — Encounter: Payer: Self-pay | Admitting: Rehabilitative and Restorative Service Providers"

## 2011-07-04 ENCOUNTER — Encounter: Payer: Self-pay | Admitting: Physical Therapy

## 2011-07-06 ENCOUNTER — Encounter: Payer: Self-pay | Admitting: Physical Therapy

## 2011-09-07 ENCOUNTER — Encounter: Payer: Self-pay | Admitting: *Deleted

## 2011-09-07 DIAGNOSIS — C541 Malignant neoplasm of endometrium: Secondary | ICD-10-CM

## 2011-09-09 ENCOUNTER — Ambulatory Visit: Payer: Self-pay | Attending: Gynecology | Admitting: Gynecology

## 2011-09-09 ENCOUNTER — Encounter: Payer: Self-pay | Admitting: Gynecology

## 2011-09-09 ENCOUNTER — Other Ambulatory Visit (HOSPITAL_COMMUNITY)
Admission: RE | Admit: 2011-09-09 | Discharge: 2011-09-09 | Disposition: A | Discharge status: Home / Self Care | Payer: Self-pay | Source: Ambulatory Visit | Attending: Gynecology | Admitting: Gynecology

## 2011-09-09 VITALS — BP 138/62 | HR 80 | Resp 16 | Ht 67.0 in | Wt 238.0 lb

## 2011-09-09 DIAGNOSIS — Z9079 Acquired absence of other genital organ(s): Secondary | ICD-10-CM | POA: Insufficient documentation

## 2011-09-09 DIAGNOSIS — Z90710 Acquired absence of both cervix and uterus: Secondary | ICD-10-CM | POA: Insufficient documentation

## 2011-09-09 DIAGNOSIS — Z01419 Encounter for gynecological examination (general) (routine) without abnormal findings: Secondary | ICD-10-CM | POA: Insufficient documentation

## 2011-09-09 DIAGNOSIS — C541 Malignant neoplasm of endometrium: Secondary | ICD-10-CM

## 2011-09-09 DIAGNOSIS — Z8049 Family history of malignant neoplasm of other genital organs: Secondary | ICD-10-CM | POA: Insufficient documentation

## 2011-09-09 DIAGNOSIS — Z801 Family history of malignant neoplasm of trachea, bronchus and lung: Secondary | ICD-10-CM | POA: Insufficient documentation

## 2011-09-09 DIAGNOSIS — C549 Malignant neoplasm of corpus uteri, unspecified: Secondary | ICD-10-CM | POA: Insufficient documentation

## 2011-09-09 DIAGNOSIS — E669 Obesity, unspecified: Secondary | ICD-10-CM | POA: Insufficient documentation

## 2011-09-09 NOTE — Progress Notes (Signed)
Addended by: Randel Pigg on: 09/09/2011 08:29 AM   Modules accepted: Orders

## 2011-09-09 NOTE — Patient Instructions (Signed)
Please schedule colonoscopy  Return to see me in clinic in 3 months

## 2011-09-09 NOTE — Progress Notes (Signed)
Consult Note: Gyn-Onc   [name] 61 y.o. female  No chief complaint on file.   Interval History: The patient returns today as previously scheduled for followup. Since her last visit she's done well. She denies any GI or GU symptoms are functional status is excellent. She denies any pelvic pain pressure vaginal bleeding or discharge.  The patient reports she has annual mammograms but has never had a colonoscopy. She does not have a primary care physician.  HPI: The patient underwent a TAH-BSO, pelvic and  paraaortic lymphadenectomy on Jan 04, 2011. Final pathology showed a  grade 3 endometrial cancer with superficial myometrial invasion (stage  IA). No adjuvant therapy has been recommended.   Allergies  Allergen Reactions  . Codeine Nausea Only  . Cytotec (Misoprostol) Nausea Only    Past Medical History  Diagnosis Date  . Obesity   . Cancer 5/12    gr 3 stg 1A    Past Surgical History  Procedure Date  . Toe surgery   . Abdominal hysterectomy 5/12    TAHBSO pelvic and paraaortic LND    No current outpatient prescriptions on file.    History   Social History  . Marital Status: Single    Spouse Name: N/A    Number of Children: N/A  . Years of Education: N/A   Occupational History  . Not on file.   Social History Main Topics  . Smoking status: Never Smoker   . Smokeless tobacco: Never Used  . Alcohol Use: Yes     occasional  . Drug Use: No  . Sexually Active: Not Currently   Other Topics Concern  . Not on file   Social History Narrative  . No narrative on file    Family History  Problem Relation Age of Onset  . Diabetes Father   . Lung cancer Sister   . Uterine cancer Sister     Review of Systems: 10 point review is negative except as noted above  Vitals: There were no vitals taken for this visit.  Physical Exam: In general patient is a healthy Afro-American female no acute distress  HEENT is negative  Neck is supple without  thyromegaly  There is no supraclavicular or inguinal adenopathy  Abdomen is soft nontender no masses organomegaly ascites or hernias are noted. Her midline incision is well-healed.  Pelvic exam EGBUS normal  Vagina is normal and no lesions are noted the cuff is well supported  Cervix and uterus are surgically absent  Adnexa are without masses  Rectovaginal exam confirms    Assessment/Plan: Stage IA grade 3 endometrial carcinoma May 2012. No evidence of disease  Plan patient will schedule colonoscopy. Pap smears are obtained  She returned to see me again in 3 months for continued followup   CLARKE-PEARSON,Jasmine Buick L, MD 09/09/2011, 7:39 AM                         Consult Note: Gyn-Onc   [name] 61 y.o. female  No chief complaint on file.   Interval History:   HPI:  Allergies  Allergen Reactions  . Codeine Nausea Only  . Cytotec (Misoprostol) Nausea Only    Past Medical History  Diagnosis Date  . Obesity   . Cancer 5/12    gr 3 stg 1A    Past Surgical History  Procedure Date  . Toe surgery   . Abdominal hysterectomy 5/12    TAHBSO pelvic and paraaortic LND  No current outpatient prescriptions on file.    History   Social History  . Marital Status: Single    Spouse Name: N/A    Number of Children: N/A  . Years of Education: N/A   Occupational History  . Not on file.   Social History Main Topics  . Smoking status: Never Smoker   . Smokeless tobacco: Never Used  . Alcohol Use: Yes     occasional  . Drug Use: No  . Sexually Active: Not Currently   Other Topics Concern  . Not on file   Social History Narrative  . No narrative on file    Family History  Problem Relation Age of Onset  . Diabetes Father   . Lung cancer Sister   . Uterine cancer Sister     Review of Systems:  Vitals: There were no vitals taken for this visit.  Physical Exam:  Assessment/Plan:   Jeannette Corpus,  MD 09/09/2011, 7:39 AM

## 2011-09-22 ENCOUNTER — Encounter: Payer: Self-pay | Admitting: Internal Medicine

## 2011-10-11 ENCOUNTER — Ambulatory Visit (AMBULATORY_SURGERY_CENTER): Payer: Self-pay

## 2011-10-11 VITALS — Ht 67.0 in | Wt 238.0 lb

## 2011-10-11 DIAGNOSIS — Z8542 Personal history of malignant neoplasm of other parts of uterus: Secondary | ICD-10-CM

## 2011-10-11 DIAGNOSIS — Z1211 Encounter for screening for malignant neoplasm of colon: Secondary | ICD-10-CM

## 2011-10-11 MED ORDER — PEG-KCL-NACL-NASULF-NA ASC-C 100 G PO SOLR: 1.0000 | Freq: Once | ORAL | Status: AC

## 2011-10-25 ENCOUNTER — Encounter: Payer: Self-pay | Admitting: Internal Medicine

## 2011-10-25 ENCOUNTER — Ambulatory Visit (AMBULATORY_SURGERY_CENTER): Payer: Self-pay | Admitting: Internal Medicine

## 2011-10-25 VITALS — BP 120/94 | HR 78 | Temp 96.4°F | Resp 16 | Ht 67.0 in | Wt 238.0 lb

## 2011-10-25 DIAGNOSIS — D126 Benign neoplasm of colon, unspecified: Secondary | ICD-10-CM

## 2011-10-25 DIAGNOSIS — Z1211 Encounter for screening for malignant neoplasm of colon: Secondary | ICD-10-CM

## 2011-10-25 MED ORDER — SODIUM CHLORIDE 0.9 % IV SOLN: 500.0000 mL | INTRAVENOUS | Status: DC

## 2011-10-25 NOTE — Progress Notes (Signed)
Patient did not experience any of the following events: a burn prior to discharge; a fall within the facility; wrong site/side/patient/procedure/implant event; or a hospital transfer or hospital admission upon discharge from the facility. (G8907) Patient did not have preoperative order for IV antibiotic SSI prophylaxis. (G8918)  

## 2011-10-25 NOTE — Op Note (Signed)
[facility] [address] [place], [place]  [address]  COLONOSCOPY PROCEDURE REPORT  PATIENT:  Jasmine, Moore  MR#:  [id number] BIRTHDATE:  July 09, 1951, 60 yrs. old  GENDER:  female ENDOSCOPIST:  Hedwig Morton. Juanda Chance, MD REF. BY:  De Blanch, M.D. PROCEDURE DATE:  10/25/2011 PROCEDURE:  Colonoscopy with biopsy ASA CLASS:  Class II INDICATIONS:  colorectal cancer screening, average risk endometrial cancer 01/2011, s/p TAH-BSO MEDICATIONS:   MAC sedation, administered by CRNA, propofol (Diprivan) 250 mcg  DESCRIPTION OF PROCEDURE:   After the risks and benefits and of the procedure were explained, informed consent was obtained. Digital rectal exam was performed and revealed no rectal masses. The LB [id number] endoscope was introduced through the anus and advanced to the cecum, which was identified by both the appendix and ileocecal valve.  The quality of the prep was excellent, using MoviPrep.  The instrument was then slowly withdrawn as the colon was fully examined. <<PROCEDUREIMAGES>>  FINDINGS:  A diminutive polyp was found. at 20 cm 2 mm polyp The polyp was removed using cold biopsy forceps (see image3). Scattered diverticula were found in the sigmoid colon (see image4). This was otherwise a normal examination of the colon (see image5, image2, and image1).   Retroflexion was not performed.  The scope was then withdrawn from the patient and the procedure completed.  COMPLICATIONS:  None ENDOSCOPIC IMPRESSION: 1) Diminutive polyp 2) Diverticula, scattered in the sigmoid colon 3) Otherwise normal examination RECOMMENDATIONS: 1) Await pathology results  REPEAT EXAM:  In 10 year(s) for.  ______________________________ Hedwig Morton. Juanda Chance, MD  CC:  n. eSIGNED:   Hedwig Morton. Oren Barella at 10/25/2011 09:42 AM  Patria Mane, [phone]

## 2011-10-25 NOTE — Patient Instructions (Addendum)
YOU HAD AN ENDOSCOPIC PROCEDURE TODAY AT THE [facility]: Refer to the procedure report that was given to you for any specific questions about what was found during the examination.  If the procedure report does not answer your questions, please call your gastroenterologist to clarify.  If you requested that your care partner not be given the details of your procedure findings, then the procedure report has been included in a sealed envelope for you to review at your convenience later.  YOU SHOULD EXPECT: Some feelings of bloating in the abdomen. Passage of more gas than usual.  Walking can help get rid of the air that was put into your GI tract during the procedure and reduce the bloating. If you had a lower endoscopy (such as a colonoscopy or flexible sigmoidoscopy) you may notice spotting of blood in your stool or on the toilet paper. If you underwent a bowel prep for your procedure, then you may not have a normal bowel movement for a few days.  DIET: Your first meal following the procedure should be a light meal and then it is ok to progress to your normal diet.  A half-sandwich or bowl of soup is an example of a good first meal.  Heavy or fried foods are harder to digest and may make you feel nauseous or bloated.  Likewise meals heavy in dairy and vegetables can cause extra gas to form and this can also increase the bloating.  Drink plenty of fluids but you should avoid alcoholic beverages for 24 hours.  ACTIVITY: Your care partner should take you home directly after the procedure.  You should plan to take it easy, moving slowly for the rest of the day.  You can resume normal activity the day after the procedure however you should NOT DRIVE or use heavy machinery for 24 hours (because of the sedation medicines used during the test).    SYMPTOMS TO REPORT IMMEDIATELY: A gastroenterologist can be reached at any hour.  During normal business hours, 8:30 AM to 5:00 PM Monday through Friday,  call [phone].  After hours and on weekends, please call the GI answering service at [phone] who will take a message and have the physician on call contact you.   Following lower endoscopy (colonoscopy or flexible sigmoidoscopy):  Excessive amounts of blood in the stool  Significant tenderness or worsening of abdominal pains  Swelling of the abdomen that is new, acute  Fever of 100F or higher Diverticulosis Diverticulosis is a common condition that develops when small pouches (diverticula) form in the wall of the colon. The risk of diverticulosis increases with age. It happens more often in people who eat a low-fiber diet. Most individuals with diverticulosis have no symptoms. Those individuals with symptoms usually experience abdominal pain, constipation, or loose stools (diarrhea). HOME CARE INSTRUCTIONS   Increase the amount of fiber in your diet as directed by your caregiver or dietician. This may reduce symptoms of diverticulosis.   Your caregiver may recommend taking a dietary fiber supplement.   Drink at least 6 to 8 glasses of water each day to prevent constipation.   Try not to strain when you have a bowel movement.   Your caregiver may recommend avoiding nuts and seeds to prevent complications, although this is still an uncertain benefit.   Only take over-the-counter or prescription medicines for pain, discomfort, or fever as directed by your caregiver.  FOODS WITH HIGH FIBER CONTENT INCLUDE:  Fruits. Apple, peach, pear, tangerine, raisins, prunes.     Vegetables. Brussels sprouts, asparagus, broccoli, cabbage, carrot, cauliflower, romaine lettuce, spinach, summer squash, tomato, winter squash, zucchini.   Starchy Vegetables. Baked beans, kidney beans, lima beans, split peas, lentils, potatoes (with skin).   Grains. Whole wheat bread, brown rice, bran flake cereal, plain oatmeal, white rice, shredded wheat, bran muffins.  SEEK IMMEDIATE MEDICAL CARE IF:   You  develop increasing pain or severe bloating.   You have an oral temperature above 102 F (38.9 C), not controlled by medicine.   You develop vomiting or bowel movements that are bloody or black.  Document Released: 05/19/2004 Document Revised: 05/04/2011 Document Reviewed: 01/20/2010 ExitCare Patient Information 2012 [employer].Colon Polyps A polyp is extra tissue that grows inside your body. Colon polyps grow in the large intestine. The large intestine, also called the colon, is part of your digestive system. It is a long, hollow tube at the end of your digestive tract where your body makes and stores stool. Most polyps are not dangerous. They are benign. This means they are not cancerous. But over time, some types of polyps can turn into cancer. Polyps that are smaller than a pea are usually not harmful. But larger polyps could someday become or may already be cancerous. To be safe, doctors remove all polyps and test them.  WHO GETS POLYPS? Anyone can get polyps, but certain people are more likely than others. You may have a greater chance of getting polyps if:  You are over 50.   You have had polyps before.   Someone in your family has had polyps.   Someone in your family has had cancer of the large intestine.   Find out if someone in your family has had polyps. You may also be more likely to get polyps if you:   Eat a lot of fatty foods.   Smoke.   Drink alcohol.   Do not exercise.   Eat too much.  SYMPTOMS  Most small polyps do not cause symptoms. People often do not know they have one until their caregiver finds it during a regular checkup or while testing them for something else. Some people do have symptoms like these:  Bleeding from the anus. You might notice blood on your underwear or on toilet paper after you have had a bowel movement.   Constipation or diarrhea that lasts more than a week.   Blood in the stool. Blood can make stool look black or it can show up  as red streaks in the stool.  If you have any of these symptoms, see your caregiver. HOW DOES THE DOCTOR TEST FOR POLYPS? The doctor can use four tests to check for polyps:  Digital rectal exam. The caregiver wears gloves and checks your rectum (the last part of the large intestine) to see if it feels normal. This test would find polyps only in the rectum. Your caregiver may need to do one of the other tests listed below to find polyps higher up in the intestine.   Barium enema. The caregiver puts a liquid called barium into your rectum before taking x-rays of your large intestine. Barium makes your intestine look white in the pictures. Polyps are dark, so they are easy to see.   Sigmoidoscopy. With this test, the caregiver can see inside your large intestine. A thin flexible tube is placed into your rectum. The device is called a sigmoidoscope, which has a light and a tiny video camera in it. The caregiver uses the sigmoidoscope to look at the last   third of your large intestine.   Colonoscopy. This test is like sigmoidoscopy, but the caregiver looks at all of the large intestine. It usually requires sedation. This is the most common method for finding and removing polyps.  TREATMENT   The caregiver will remove the polyp during sigmoidoscopy or colonoscopy. The polyp is then tested for cancer.   If you have had polyps, your caregiver may want you to get tested regularly in the future.  PREVENTION  There is not one sure way to prevent polyps. You might be able to lower your risk of getting them if you:  Eat more fruits and vegetables and less fatty food.   Do not smoke.   Avoid alcohol.   Exercise every day.   Lose weight if you are overweight.   Eating more calcium and folate can also lower your risk of getting polyps. Some foods that are rich in calcium are milk, cheese, and broccoli. Some foods that are rich in folate are chickpeas, kidney beans, and spinach.   Aspirin might help  prevent polyps. Studies are under way.  Document Released: 05/18/2004 Document Revised: 05/04/2011 Document Reviewed: 10/24/2007 ExitCare Patient Information 2012 [employer]. FOLLOW UP: If any biopsies were taken you will be contacted by phone or by letter within the next 1-3 weeks.  Call your gastroenterologist if you have not heard about the biopsies in 3 weeks.  Our staff will call the home number listed on your records the next business day following your procedure to check on you and address any questions or concerns that you may have at that time regarding the information given to you following your procedure. This is a courtesy call and so if there is no answer at the home number and we have not heard from you through the emergency physician on call, we will assume that you have returned to your regular daily activities without incident.  SIGNATURES/CONFIDENTIALITY: You and/or your care partner have signed paperwork which will be entered into your electronic medical record.  These signatures attest to the fact that that the information above on your After Visit Summary has been reviewed and is understood.  Full responsibility of the confidentiality of this discharge information lies with you and/or your care-partner.  

## 2011-10-26 ENCOUNTER — Telehealth: Payer: Self-pay | Admitting: *Deleted

## 2011-10-26 NOTE — Telephone Encounter (Signed)
  Follow up Call-  Call back number 10/25/2011  Post procedure Call Back phone  # [phone]  Permission to leave phone message Yes     Patient questions:  Do you have a fever, pain , or abdominal swelling? no Pain Score  0 *  Have you tolerated food without any problems? yes  Have you been able to return to your normal activities? yes  Do you have any questions about your discharge instructions: Diet   no Medications  no Follow up visit  no  Do you have questions or concerns about your Care? no  Actions: * If pain score is 4 or above: No action needed, pain <4.

## 2011-10-31 ENCOUNTER — Encounter: Payer: Self-pay | Admitting: Internal Medicine

## 2011-11-17 ENCOUNTER — Other Ambulatory Visit: Payer: Self-pay | Admitting: Gynecology

## 2011-11-17 DIAGNOSIS — Z1231 Encounter for screening mammogram for malignant neoplasm of breast: Secondary | ICD-10-CM

## 2011-12-09 ENCOUNTER — Encounter: Payer: Self-pay | Admitting: Gynecology

## 2011-12-09 ENCOUNTER — Other Ambulatory Visit (HOSPITAL_COMMUNITY)
Admission: RE | Admit: 2011-12-09 | Discharge: 2011-12-09 | Disposition: A | Discharge status: Home / Self Care | Payer: Self-pay | Source: Ambulatory Visit | Attending: Gynecology | Admitting: Gynecology

## 2011-12-09 ENCOUNTER — Ambulatory Visit: Payer: Self-pay | Attending: Gynecology | Admitting: Gynecology

## 2011-12-09 VITALS — BP 132/76 | HR 62 | Temp 97.8°F | Resp 18 | Ht 67.0 in | Wt 241.5 lb

## 2011-12-09 DIAGNOSIS — C541 Malignant neoplasm of endometrium: Secondary | ICD-10-CM

## 2011-12-09 DIAGNOSIS — Z90710 Acquired absence of both cervix and uterus: Secondary | ICD-10-CM | POA: Insufficient documentation

## 2011-12-09 DIAGNOSIS — C549 Malignant neoplasm of corpus uteri, unspecified: Secondary | ICD-10-CM | POA: Insufficient documentation

## 2011-12-09 DIAGNOSIS — Z01419 Encounter for gynecological examination (general) (routine) without abnormal findings: Secondary | ICD-10-CM | POA: Insufficient documentation

## 2011-12-09 DIAGNOSIS — Z9079 Acquired absence of other genital organ(s): Secondary | ICD-10-CM | POA: Insufficient documentation

## 2011-12-09 DIAGNOSIS — Z7982 Long term (current) use of aspirin: Secondary | ICD-10-CM | POA: Insufficient documentation

## 2011-12-09 NOTE — Patient Instructions (Signed)
Return in 6 months

## 2011-12-09 NOTE — Progress Notes (Signed)
Addended by: Randel Pigg on: 12/09/2011 02:03 PM   Modules accepted: Orders

## 2011-12-09 NOTE — Progress Notes (Signed)
Consult Note: Gyn-Onc   [name] 61 y.o. female  Chief Complaint  Patient presents with  . Endometrial Cancer    Follow up   Interval History: The patient returns today as previously scheduled for followup. Since her last visit she's done well. She denies any GI or GU symptoms are functional status is excellent. She denies any pelvic pain pressure vaginal bleeding or discharge.   She had colonoscopy recently and was found to have an adenomatous polyp.  She will have a repeat colonoscopy in 5 years as recommended by Dr. Juanda Chance. HPI:  The patient underwent a TAH-BSO, pelvic and  paraaortic lymphadenectomy on Jan 04, 2011. Final pathology showed a  grade 3 endometrial cancer with superficial myometrial invasion (stage  IA). No adjuvant therapy has been recommended.    Allergies  Allergen Reactions  . Codeine Nausea Only  . Cytotec (Misoprostol) Nausea Only    Past Medical History  Diagnosis Date  . Obesity   . Cancer 5/12    gr 3 stg 1A    Past Surgical History  Procedure Date  . Toe surgery     Bil feet  . Abdominal hysterectomy 5/12    TAHBSO pelvic and paraaortic LND    Current Outpatient Prescriptions  Medication Sig Dispense Refill  . aspirin 325 MG tablet Take 325 mg by mouth as needed. Take 2 pills prn      . calcium citrate-vitamin D (CITRACAL+D) 315-200 MG-UNIT per tablet Take 1 tablet by mouth daily.      . Calcium-Magnesium-Zinc (CAL-MAG-ZINC PO) Take by mouth 2 (two) times daily.        History   Social History  . Marital Status: Single    Spouse Name: N/A    Number of Children: N/A  . Years of Education: N/A   Occupational History  . Not on file.   Social History Main Topics  . Smoking status: Never Smoker   . Smokeless tobacco: Never Used  . Alcohol Use: No     occasional  . Drug Use: No  . Sexually Active: Not Currently   Other Topics Concern  . Not on file   Social History Narrative  . No narrative on file    Family History    Problem Relation Age of Onset  . Diabetes Father   . Lung cancer Sister   . Uterine cancer Sister            Vitals: Blood pressure 132/76, pulse 62, temperature 97.8 F (36.6 C), temperature source Oral, resp. rate 18, height 5\' 7"  (1.702 m), weight 241 lb 8 oz (109.544 kg).   Review of Systems: 10 point review is negative except as noted above    Physical Exam: In general patient is a healthy Afro-American female no acute distress  HEENT is negative  Neck is supple without thyromegaly  There is no supraclavicular or inguinal adenopathy  Abdomen is soft nontender no masses organomegaly ascites or hernias are noted. Her midline incision is well-healed.  Pelvic exam EGBUS normal  Vagina is normal and no lesions are noted the cuff is well supported  Cervix and uterus are surgically absent  Adnexa are without masses  Rectovaginal exam confirms  Assessment/Plan: Stage IA grade 3 endometrial carcinoma May 2012. No evidence of disease  Plan patient is scheduled for Mammograms later this month.  . Pap smears are obtained  She returned to see me again in 6 months for continued followup    CLARKE-PEARSON,Sallyanne Birkhead L, MD 12/09/2011, 8:15  AM                         Consult Note: Gyn-Onc   [name] 61 y.o. female  Chief Complaint  Patient presents with  . Endometrial Cancer    Follow up    Interval History:   HPI:  Allergies  Allergen Reactions  . Codeine Nausea Only  . Cytotec (Misoprostol) Nausea Only    Past Medical History  Diagnosis Date  . Obesity   . Cancer 5/12    gr 3 stg 1A    Past Surgical History  Procedure Date  . Toe surgery     Bil feet  . Abdominal hysterectomy 5/12    TAHBSO pelvic and paraaortic LND    Current Outpatient Prescriptions  Medication Sig Dispense Refill  . aspirin 325 MG tablet Take 325 mg by mouth as needed. Take 2 pills prn      . calcium citrate-vitamin D (CITRACAL+D) 315-200 MG-UNIT per tablet Take 1  tablet by mouth daily.      . Calcium-Magnesium-Zinc (CAL-MAG-ZINC PO) Take by mouth 2 (two) times daily.        History   Social History  . Marital Status: Single    Spouse Name: N/A    Number of Children: N/A  . Years of Education: N/A   Occupational History  . Not on file.   Social History Main Topics  . Smoking status: Never Smoker   . Smokeless tobacco: Never Used  . Alcohol Use: No     occasional  . Drug Use: No  . Sexually Active: Not Currently   Other Topics Concern  . Not on file   Social History Narrative  . No narrative on file    Family History  Problem Relation Age of Onset  . Diabetes Father   . Lung cancer Sister   . Uterine cancer Sister     Review of Systems:  Vitals: Blood pressure 132/76, pulse 62, temperature 97.8 F (36.6 C), temperature source Oral, resp. rate 18, height 5\' 7"  (1.702 m), weight 241 lb 8 oz (109.544 kg).  Physical Exam:  Assessment/Plan:   Jeannette Corpus, MD 12/09/2011, 8:15 AM

## 2011-12-22 ENCOUNTER — Ambulatory Visit (HOSPITAL_COMMUNITY)
Admission: RE | Admit: 2011-12-22 | Discharge: 2011-12-22 | Disposition: A | Discharge status: Home / Self Care | Payer: Self-pay | Source: Ambulatory Visit | Attending: Gynecology | Admitting: Gynecology

## 2011-12-22 DIAGNOSIS — Z1231 Encounter for screening mammogram for malignant neoplasm of breast: Secondary | ICD-10-CM | POA: Insufficient documentation

## 2011-12-29 ENCOUNTER — Telehealth: Payer: Self-pay | Admitting: Gynecologic Oncology

## 2011-12-29 NOTE — Telephone Encounter (Signed)
Message left for patient with pap smear results: negative.  Instructed to call for any questions or concerns.  

## 2012-06-20 ENCOUNTER — Other Ambulatory Visit (HOSPITAL_COMMUNITY)
Admission: RE | Admit: 2012-06-20 | Discharge: 2012-06-20 | Disposition: A | Discharge status: Home / Self Care | Payer: Self-pay | Source: Ambulatory Visit | Attending: Gynecology | Admitting: Gynecology

## 2012-06-20 ENCOUNTER — Encounter: Payer: Self-pay | Admitting: Gynecology

## 2012-06-20 ENCOUNTER — Ambulatory Visit: Payer: Self-pay | Attending: Gynecology | Admitting: Gynecology

## 2012-06-20 VITALS — BP 130/72 | HR 72 | Temp 98.3°F | Resp 20 | Ht 67.0 in | Wt 256.6 lb

## 2012-06-20 DIAGNOSIS — Z01419 Encounter for gynecological examination (general) (routine) without abnormal findings: Secondary | ICD-10-CM | POA: Insufficient documentation

## 2012-06-20 DIAGNOSIS — C549 Malignant neoplasm of corpus uteri, unspecified: Secondary | ICD-10-CM | POA: Insufficient documentation

## 2012-06-20 DIAGNOSIS — Z90710 Acquired absence of both cervix and uterus: Secondary | ICD-10-CM | POA: Insufficient documentation

## 2012-06-20 DIAGNOSIS — Z9079 Acquired absence of other genital organ(s): Secondary | ICD-10-CM | POA: Insufficient documentation

## 2012-06-20 DIAGNOSIS — C541 Malignant neoplasm of endometrium: Secondary | ICD-10-CM

## 2012-06-20 NOTE — Addendum Note (Signed)
Addended by: Jeannette Corpus on: 06/20/2012 09:47 AM   Modules accepted: Orders

## 2012-06-20 NOTE — Progress Notes (Addendum)
Consult Note: Gyn-Onc   [name] 61 y.o. female  Chief Complaint  Patient presents with  . Endometrial adenocarcinoma    Follow up    Interval History: The patient returns today as previously scheduled for followup. Since her last visit 6 months ago she has done well she had mammograms which were negative otherwise she has no GI GU or pelvic symptoms. Specifically denies any vaginal bleeding. Functional status is excellent. The patient denies any neurological deficit in her leg.  HPI:The patient underwent a TAH-BSO, pelvic and  paraaortic lymphadenectomy on Jan 04, 2011. Final pathology showed a  grade 3 endometrial cancer with superficial myometrial invasion (stage  IA). No adjuvant therapy has been recommended.      Review of Systems:10 point review of systems is negative as noted above.   Vitals: Blood pressure 130/72, pulse 72, temperature 98.3 F (36.8 C), temperature source Oral, resp. rate 20, height 5\' 7"  (1.702 m), weight 256 lb 9.6 oz (116.393 kg).  Physical Exam: General : The patient is a healthy woman in no acute distress.  HEENT: normocephalic, extraoccular movements normal; neck is supple without thyromegally  Lynphnodes: Supraclavicular and inguinal nodes not enlarged  Abdomen: Soft, non-tender, no ascites, no organomegally, no masses, no hernias  Pelvic:  EGBUS: Normal female  Vagina: Normal, no lesions  Urethra and Bladder: Normal, non-tender  Cervix: Surgically absent  Uterus: Surgically absent  Bi-manual examination: Non-tender; no adenxal masses or nodularity  Rectal: normal sphincter tone, no masses, no blood  Lower extremities: No edema or varicosities. Normal range of motion    Assessment/Plan: Stage IA grade 3 endometrial carcinoma. No evidence of disease.  Pap smears are obtained. The patient return to see me in 6 months for continuing followup.  Allergies  Allergen Reactions  . Codeine Nausea Only  . Cytotec (Misoprostol) Nausea Only     Past Medical History  Diagnosis Date  . Obesity   . Cancer 5/12    gr 3 stg 1A    Past Surgical History  Procedure Date  . Toe surgery     Bil feet  . Abdominal hysterectomy 5/12    TAHBSO pelvic and paraaortic LND    Current Outpatient Prescriptions  Medication Sig Dispense Refill  . aspirin 325 MG tablet Take 325 mg by mouth as needed. Take 2 pills prn      . Calcium Carb-Cholecalciferol (CALCIUM 1000 + D PO) Take 1 tablet by mouth daily. 21st century brand ( Calcium 1000 + D3)      . calcium citrate-vitamin D (CITRACAL+D) 315-200 MG-UNIT per tablet Take 1 tablet by mouth daily.      . Calcium-Magnesium-Zinc (CAL-MAG-ZINC PO) Take by mouth 2 (two) times daily.        History   Social History  . Marital Status: Single    Spouse Name: N/A    Number of Children: N/A  . Years of Education: N/A   Occupational History  . Not on file.   Social History Main Topics  . Smoking status: Never Smoker   . Smokeless tobacco: Never Used  . Alcohol Use: No     occasional  . Drug Use: No  . Sexually Active: Not Currently   Other Topics Concern  . Not on file   Social History Narrative  . No narrative on file    Family History  Problem Relation Age of Onset  . Diabetes Father   . Lung cancer Sister   . Uterine cancer Sister  Jeannette Corpus, MD 06/20/2012, 9:39 AM

## 2012-06-20 NOTE — Patient Instructions (Signed)
We will contact you if you're Pap smear report. Please plan on seeing [place] again in 6 months.

## 2012-06-28 ENCOUNTER — Telehealth: Payer: Self-pay | Admitting: Gynecologic Oncology

## 2012-06-28 NOTE — Telephone Encounter (Signed)
Message left for patient with pap smear results: negative.  Instructed to call for any questions or concerns.  

## 2012-11-26 ENCOUNTER — Other Ambulatory Visit: Payer: Self-pay | Admitting: Gynecology

## 2012-11-26 DIAGNOSIS — Z1231 Encounter for screening mammogram for malignant neoplasm of breast: Secondary | ICD-10-CM

## 2012-12-03 ENCOUNTER — Encounter: Payer: Self-pay | Admitting: Gynecology

## 2012-12-03 ENCOUNTER — Other Ambulatory Visit (HOSPITAL_COMMUNITY)
Admission: RE | Admit: 2012-12-03 | Discharge: 2012-12-03 | Disposition: A | Discharge status: Home / Self Care | Payer: Self-pay | Source: Ambulatory Visit | Attending: Gynecology | Admitting: Gynecology

## 2012-12-03 ENCOUNTER — Ambulatory Visit: Payer: Self-pay | Attending: Gynecology | Admitting: Gynecology

## 2012-12-03 VITALS — BP 132/80 | HR 68 | Temp 98.4°F | Resp 20 | Ht 67.0 in | Wt 261.4 lb

## 2012-12-03 DIAGNOSIS — C541 Malignant neoplasm of endometrium: Secondary | ICD-10-CM

## 2012-12-03 DIAGNOSIS — Z01419 Encounter for gynecological examination (general) (routine) without abnormal findings: Secondary | ICD-10-CM | POA: Insufficient documentation

## 2012-12-03 DIAGNOSIS — C549 Malignant neoplasm of corpus uteri, unspecified: Secondary | ICD-10-CM | POA: Insufficient documentation

## 2012-12-03 NOTE — Progress Notes (Signed)
Consult Note: Gyn-Onc   [name] 62 y.o. female  Chief Complaint  Patient presents with  . Endometrial Cancer    Follow up    Assessment : Stage IA grade 3 endometrial carcinoma May 2012. No evidence of disease.  Plan: Pap smears are obtained. The patient return to see [place] in 6 months.  Interval History: The patient returns today as previously scheduled for followup. Since her last visit she's done well. She denies any GI or GU symptoms are functional status is excellent. She denies any pelvic pain pressure vaginal bleeding or discharge.     HPI:  The patient underwent a TAH-BSO, pelvic and paraaortic lymphadenectomy on Jan 04, 2011. Final pathology showed a  grade 3 endometrial cancer with superficial myometrial invasion (stage IA). No adjuvant therapy has been recommended.   Review of Systems:10 point review of systems is negative except as noted in interval history.   Vitals: Blood pressure 132/80, pulse 68, temperature 98.4 F (36.9 C), resp. rate 20, height 5\' 7"  (1.702 m), weight 261 lb 6.4 oz (118.57 kg).  Physical Exam: General : The patient is a healthy woman in no acute distress.  HEENT: normocephalic, extraoccular movements normal; neck is supple without thyromegally  Lynphnodes: Supraclavicular and inguinal nodes not enlarged  Abdomen: Soft, non-tender, no ascites, no organomegally, no masses, no hernias  Pelvic:  EGBUS: Normal female  Vagina: Normal, no lesions  Urethra and Bladder: Normal, non-tender  Cervix: Surgically absent  Uterus: Surgically absent  Bi-manual examination: Non-tender; no adenxal masses or nodularity  Rectal: normal sphincter tone, no masses, no blood  Lower extremities: No edema or varicosities. Normal range of motion      Allergies  Allergen Reactions  . Codeine Nausea Only  . Cytotec (Misoprostol) Nausea Only    Past Medical History  Diagnosis Date  . Obesity   . Cancer 5/12    gr 3 stg 1A    Past Surgical History   Procedure Laterality Date  . Toe surgery      Bil feet  . Abdominal hysterectomy  5/12    TAHBSO pelvic and paraaortic LND    Current Outpatient Prescriptions  Medication Sig Dispense Refill  . aspirin 325 MG tablet Take 325 mg by mouth as needed. Take 2 pills prn      . Calcium 1200-1000 MG-UNIT CHEW Chew by mouth daily.      . Calcium Carb-Cholecalciferol (CALCIUM 1000 + D PO) Take 1 tablet by mouth daily. 21st century brand ( Calcium 1000 + D3)      . calcium citrate-vitamin D (CITRACAL+D) 315-200 MG-UNIT per tablet Take 1 tablet by mouth daily.      . Calcium-Magnesium-Zinc (CAL-MAG-ZINC PO) Take by mouth 2 (two) times daily.       No current facility-administered medications for this visit.    History   Social History  . Marital Status: Single    Spouse Name: N/A    Number of Children: N/A  . Years of Education: N/A   Occupational History  . Not on file.   Social History Main Topics  . Smoking status: Never Smoker   . Smokeless tobacco: Never Used  . Alcohol Use: No     Comment: occasional  . Drug Use: No  . Sexually Active: Not Currently   Other Topics Concern  . Not on file   Social History Narrative  . No narrative on file    Family History  Problem Relation Age of Onset  . Diabetes Father   .  Lung cancer Sister   . Uterine cancer Sister       Jeannette Corpus, MD 12/03/2012, 9:32 AM

## 2012-12-03 NOTE — Patient Instructions (Signed)
We will contact you if the Pap smear report. Return to see us in 6 months. 

## 2012-12-07 ENCOUNTER — Telehealth: Payer: Self-pay | Admitting: *Deleted

## 2012-12-07 NOTE — Telephone Encounter (Signed)
Notified patient of Pap results 

## 2012-12-25 ENCOUNTER — Ambulatory Visit (HOSPITAL_COMMUNITY)
Admission: RE | Admit: 2012-12-25 | Discharge: 2012-12-25 | Disposition: A | Discharge status: Home / Self Care | Payer: Self-pay | Source: Ambulatory Visit | Attending: Gynecology | Admitting: Gynecology

## 2012-12-25 DIAGNOSIS — Z1231 Encounter for screening mammogram for malignant neoplasm of breast: Secondary | ICD-10-CM | POA: Insufficient documentation

## 2013-06-28 ENCOUNTER — Ambulatory Visit: Payer: Self-pay | Attending: Gynecologic Oncology | Admitting: Gynecologic Oncology

## 2013-06-28 ENCOUNTER — Encounter: Payer: Self-pay | Admitting: Gynecologic Oncology

## 2013-06-28 ENCOUNTER — Ambulatory Visit: Payer: Self-pay | Admitting: Lab

## 2013-06-28 ENCOUNTER — Other Ambulatory Visit (HOSPITAL_COMMUNITY)
Admission: RE | Admit: 2013-06-28 | Discharge: 2013-06-28 | Disposition: A | Discharge status: Home / Self Care | Payer: Self-pay | Source: Ambulatory Visit | Attending: Gynecology | Admitting: Gynecology

## 2013-06-28 VITALS — BP 150/90 | HR 76 | Temp 98.4°F | Resp 16 | Ht 67.0 in | Wt 256.7 lb

## 2013-06-28 DIAGNOSIS — C541 Malignant neoplasm of endometrium: Secondary | ICD-10-CM

## 2013-06-28 DIAGNOSIS — Z01419 Encounter for gynecological examination (general) (routine) without abnormal findings: Secondary | ICD-10-CM | POA: Insufficient documentation

## 2013-06-28 LAB — LIPID PANEL
Cholesterol: 182 mg/dL (ref 0–200)
HDL: 46 mg/dL (ref >39)
LDL Cholesterol: 114 mg/dL — ABNORMAL HIGH (ref 0–99)
Total CHOL/HDL Ratio: 4 Ratio
Triglycerides: 108 mg/dL (ref <150)
VLDL: 22 mg/dL (ref 0–40)

## 2013-06-28 LAB — CBC WITH DIFFERENTIAL/PLATELET
BASO%: 1.1 % (ref 0.0–2.0)
Basophils Absolute: 0.1 10*3/uL (ref 0.0–0.1)
EOS%: 3.2 % (ref 0.0–7.0)
MCH: 28 pg (ref 25.1–34.0)
MCHC: 33.4 g/dL (ref 31.5–36.0)
MCV: 84 fL (ref 79.5–101.0)
MONO%: 12.6 % (ref 0.0–14.0)
RBC: 4.98 10*6/uL (ref 3.70–5.45)
RDW: 13.8 % (ref 11.2–14.5)
lymph#: 2 10*3/uL (ref 0.9–3.3)

## 2013-06-28 LAB — BASIC METABOLIC PANEL (CC13)
CO2: 20 mEq/L — ABNORMAL LOW (ref 22–29)
Chloride: 110 mEq/L — ABNORMAL HIGH (ref 98–109)
Potassium: 4.1 mEq/L (ref 3.5–5.1)

## 2013-06-28 NOTE — Progress Notes (Signed)
Follow Up Note: Gyn-Onc  [name] 62 y.o. female  CC:  Chief Complaint  Patient presents with  . Endometrial cancer    Follow up    HPI:  [name] is a 63 year old who underwent a TAH-BSO, pelvic and paraaortic lymphadenectomy on Jan 04, 2011. Final pathology showed a grade 3 endometrial cancer with superficial myometrial invasion (stage IA). No adjuvant therapy has been recommended.  Interval History:  She presents today for continued follow up.  She states that she has been doing well since her last visit.  "Try to enjoy everyday."  She states that she does not have a primary care provider.  She states that she has had a physician breast examination earlier this year and defers the breast examination today.  She denies any GI or GU symptoms along with no vaginal bleeding or discharge.   Review of Systems  Constitutional: Feels well.  No fever, chills, early satiety, unintentional weight loss or gain, or fatigue.  Cardiovascular: No chest pain, shortness of breath, or edema.  Pulmonary: No cough or wheeze.  Gastrointestinal: No nausea, vomiting, or diarrhea. No bright red blood per rectum or change in bowel movement.  Genitourinary: No frequency, urgency, or dysuria. No vaginal bleeding or discharge.  Musculoskeletal: No myalgia or joint pain. Neurologic: No weakness, numbness, or change in gait.  Psychology: No depression, anxiety, or insomnia.  Health Maintenance: Mammogram:  12/2012 Pap Smear:12/03/2012: normal Colonoscopy:  Up to date per patient Lipid Panel: Not had  Current Meds:  Outpatient Encounter Prescriptions as of 06/28/2013  Medication Sig Dispense Refill  . Calcium 1200-1000 MG-UNIT CHEW Chew by mouth daily.      . Cholecalciferol (VITAMIN D-3 PO) Take 2 tablets by mouth daily.      . [DISCONTINUED] aspirin 325 MG tablet Take 325 mg by mouth as needed. Take 2 pills prn      . [DISCONTINUED] Calcium Carb-Cholecalciferol (CALCIUM 1000 + D PO) Take 1 tablet  by mouth daily. 21st century brand ( Calcium 1000 + D3)      . [DISCONTINUED] calcium citrate-vitamin D (CITRACAL+D) 315-200 MG-UNIT per tablet Take 1 tablet by mouth daily.      . [DISCONTINUED] Calcium-Magnesium-Zinc (CAL-MAG-ZINC PO) Take by mouth 2 (two) times daily.       No facility-administered encounter medications on file as of 06/28/2013.    Allergy:  Allergies  Allergen Reactions  . Codeine Nausea Only  . Cytotec [Misoprostol] Nausea Only    Social Hx:   History   Social History  . Marital Status: Single    Spouse Name: N/A    Number of Children: N/A  . Years of Education: N/A   Occupational History  . Not on file.   Social History Main Topics  . Smoking status: Never Smoker   . Smokeless tobacco: Never Used  . Alcohol Use: No     Comment: occasional  . Drug Use: No  . Sexual Activity: Not Currently   Other Topics Concern  . Not on file   Social History Narrative  . No narrative on file    Past Surgical Hx:  Past Surgical History  Procedure Laterality Date  . Toe surgery      Bil feet  . Abdominal hysterectomy  5/12    TAHBSO pelvic and paraaortic LND    Past Medical Hx:  Past Medical History  Diagnosis Date  . Obesity   . Cancer 5/12    gr 3 stg 1A  Family Hx:  Family History  Problem Relation Age of Onset  . Diabetes Father   . Lung cancer Sister   . Uterine cancer Sister     Vitals:  Blood pressure 150/90, pulse 76, temperature 98.4 F (36.9 C), temperature source Oral, resp. rate 16, height 5\' 7"  (1.702 m), weight 256 lb 11.2 oz (116.438 kg).  Physical Exam:  General: Well developed, well nourished female in no acute distress. Alert and oriented x 3.  Head/Neck: Sclerae anicteric.  Oropharynx clear with no lesions.  Supple without any enlargements.  Lymph node survey: No cervical, supraclavicular, or inguinal adenopathy.  Cardiovascular: Regular rate and rhythm. S1 and S2 normal.  Lungs: Clear to auscultation bilaterally. No  wheezes/crackles/rhonchi noted.  Skin: No rashes or lesions present. Back: No CVA tenderness.  Abdomen: Abdomen soft, non-tender and obese. Active bowel sounds in all quadrants. No evidence of a fluid wave or abdominal masses.  Genitourinary:    Vulva/vagina: Normal external female genitalia. No lesions.    Urethra: No lesions or masses.    Vagina: Atrophic without any lesions. No palpable masses. No vaginal bleeding or drainage noted.  ThinPrep pap obtained without difficulty.  Rectal: Good tone, no masses, no cul de sac nodularity.  Extremities: No bilateral cyanosis, edema, or clubbing.   Assessment/Plan:   62 year old who underwent a TAH-BSO, pelvic and paraaortic lymphadenectomy on Jan 04, 2011. Final pathology showed a grade 3 endometrial cancer with superficial myometrial invasion (stage IA).  No adjuvant therapy has been recommended.  She is doing well and is without evidence of disease.  It is recommended for her to obtain a primary care provider.  In the interim, a CBC, Bmet, and lipid panel will be ordered today.  She is fasting.  We will contact her with the results of her pap smear and lab work from today.  She is advised to follow up in six months or sooner if needed.      CROSS, MELISSA DEAL, NP 06/28/2013, 11:13 AM

## 2013-06-28 NOTE — Patient Instructions (Signed)
Doing great.  We will contact you with the results of your pap smear and lab work from today.  Plan to return to see [place] in six months or sooner if needed.

## 2013-06-28 NOTE — Addendum Note (Signed)
Addended by: Earlean Polka on: 06/28/2013 03:32 PM   Modules accepted: Orders

## 2013-07-03 ENCOUNTER — Telehealth: Payer: Self-pay | Admitting: Gynecologic Oncology

## 2013-07-03 NOTE — Telephone Encounter (Signed)
Patient notified of pap smear results and lab results.  Informed that information on LDL cholesterol would be mailed to her home.  No concerns voiced.  Instructed to call for any needs or concerns.

## 2013-12-12 ENCOUNTER — Other Ambulatory Visit: Payer: Self-pay | Admitting: Gynecology

## 2013-12-12 DIAGNOSIS — Z1231 Encounter for screening mammogram for malignant neoplasm of breast: Secondary | ICD-10-CM

## 2013-12-13 ENCOUNTER — Ambulatory Visit: Payer: Self-pay | Admitting: Gynecologic Oncology

## 2013-12-26 ENCOUNTER — Ambulatory Visit (HOSPITAL_COMMUNITY)
Admission: RE | Admit: 2013-12-26 | Discharge: 2013-12-26 | Disposition: A | Discharge status: Home / Self Care | Payer: No Typology Code available for payment source | Source: Ambulatory Visit | Attending: Gynecology | Admitting: Gynecology

## 2013-12-26 DIAGNOSIS — Z1231 Encounter for screening mammogram for malignant neoplasm of breast: Secondary | ICD-10-CM | POA: Insufficient documentation

## 2014-01-31 ENCOUNTER — Other Ambulatory Visit (HOSPITAL_COMMUNITY)
Admission: RE | Admit: 2014-01-31 | Discharge: 2014-01-31 | Disposition: A | Discharge status: Home / Self Care | Payer: No Typology Code available for payment source | Source: Ambulatory Visit | Attending: Gynecology | Admitting: Gynecology

## 2014-01-31 ENCOUNTER — Encounter: Payer: Self-pay | Admitting: Gynecology

## 2014-01-31 ENCOUNTER — Ambulatory Visit: Payer: No Typology Code available for payment source | Attending: Gynecology | Admitting: Gynecology

## 2014-01-31 VITALS — BP 142/71 | HR 83 | Temp 98.7°F | Resp 18 | Ht 67.0 in | Wt 243.6 lb

## 2014-01-31 DIAGNOSIS — Z01419 Encounter for gynecological examination (general) (routine) without abnormal findings: Secondary | ICD-10-CM | POA: Insufficient documentation

## 2014-01-31 DIAGNOSIS — C541 Malignant neoplasm of endometrium: Secondary | ICD-10-CM

## 2014-01-31 DIAGNOSIS — C549 Malignant neoplasm of corpus uteri, unspecified: Secondary | ICD-10-CM | POA: Insufficient documentation

## 2014-01-31 NOTE — Patient Instructions (Addendum)
We will contact you if the past report. Return to see Ms. Cross in 6 months on Aug 04, 2014 at 9:30 am . Please call our office with any concerns.

## 2014-01-31 NOTE — Addendum Note (Signed)
Addended by: Lucile Crater on: 01/31/2014 10:10 AM   Modules accepted: Orders

## 2014-01-31 NOTE — Progress Notes (Signed)
Consult Note: Gyn-Onc   [name] 63 y.o. female  Chief Complaint  Patient presents with  . Endo ca    Follow up     Assessment : Stage IA grade 3 endometrial carcinoma May 2012. No evidence of disease.  Plan: Pap smears are obtained. The patient return to see Joylene John, NP in 6 months and return to see me in one year..  Interval History: The patient returns today as previously scheduled for followup. Since her last visit she's done well. She denies any GI or GU symptoms are functional status is excellent. She denies any pelvic pain pressure vaginal bleeding or discharge. She had normal mammograms approximately 3 weeks ago.    HPI:  The patient underwent a TAH-BSO, pelvic and paraaortic lymphadenectomy on Jan 04, 2011. Final pathology showed a  grade 3 endometrial cancer with superficial myometrial invasion (stage IA). No adjuvant therapy has been recommended.   Review of Systems:10 point review of systems is negative except as noted in interval history.   Vitals: Blood pressure 142/71, pulse 83, temperature 98.7 F (37.1 C), temperature source Oral, resp. rate 18, height 5\' 7"  (1.702 m), weight 243 lb 9.6 oz (110.496 kg).  Physical Exam: General : The patient is a healthy woman in no acute distress.  HEENT: normocephalic, extraoccular movements normal; neck is supple without thyromegally  Lynphnodes: Supraclavicular and inguinal nodes not enlarged  Abdomen: Soft, non-tender, no ascites, no organomegally, no masses, no hernias  Pelvic:  EGBUS: Normal female  Vagina: Normal, no lesions  Urethra and Bladder: Normal, non-tender  Cervix: Surgically absent  Uterus: Surgically absent  Bi-manual examination: Non-tender; no adenxal masses or nodularity  Rectal: normal sphincter tone, no masses, no blood  Lower extremities: No edema or varicosities. Normal range of motion      Allergies  Allergen Reactions  . Codeine Nausea Only  . Cytotec [Misoprostol] Nausea Only     Past Medical History  Diagnosis Date  . Obesity   . Cancer 5/12    gr 3 stg 1A    Past Surgical History  Procedure Laterality Date  . Toe surgery      Bil feet  . Abdominal hysterectomy  5/12    TAHBSO pelvic and paraaortic LND    Current Outpatient Prescriptions  Medication Sig Dispense Refill  . Calcium 1200-1000 MG-UNIT CHEW Chew by mouth daily.       No current facility-administered medications for this visit.    History   Social History  . Marital Status: Single    Spouse Name: N/A    Number of Children: N/A  . Years of Education: N/A   Occupational History  . Not on file.   Social History Main Topics  . Smoking status: Never Smoker   . Smokeless tobacco: Never Used  . Alcohol Use: No     Comment: occasional  . Drug Use: No  . Sexual Activity: Not Currently   Other Topics Concern  . Not on file   Social History Narrative  . No narrative on file    Family History  Problem Relation Age of Onset  . Diabetes Father   . Lung cancer Sister   . Uterine cancer Sister       Alvino Chapel, MD 01/31/2014, 9:27 AM

## 2014-02-07 ENCOUNTER — Telehealth: Payer: Self-pay | Admitting: *Deleted

## 2014-02-07 NOTE — Telephone Encounter (Signed)
Notified pt pap smear normal. F/u in 61mths with NP. Pt verbalized understanding.

## 2014-08-04 ENCOUNTER — Other Ambulatory Visit (HOSPITAL_COMMUNITY)
Admission: RE | Admit: 2014-08-04 | Discharge: 2014-08-04 | Disposition: A | Discharge status: Home / Self Care | Payer: Medicare Other | Source: Ambulatory Visit | Attending: Gynecologic Oncology | Admitting: Gynecologic Oncology

## 2014-08-04 ENCOUNTER — Ambulatory Visit: Payer: Medicare HMO | Attending: Gynecologic Oncology | Admitting: Gynecologic Oncology

## 2014-08-04 ENCOUNTER — Encounter: Payer: Self-pay | Admitting: Gynecologic Oncology

## 2014-08-04 VITALS — BP 132/76 | HR 75 | Temp 98.2°F | Resp 22 | Ht 67.0 in | Wt 230.1 lb

## 2014-08-04 DIAGNOSIS — Z90722 Acquired absence of ovaries, bilateral: Secondary | ICD-10-CM | POA: Insufficient documentation

## 2014-08-04 DIAGNOSIS — Z9079 Acquired absence of other genital organ(s): Secondary | ICD-10-CM | POA: Diagnosis not present

## 2014-08-04 DIAGNOSIS — R87619 Unspecified abnormal cytological findings in specimens from cervix uteri: Secondary | ICD-10-CM | POA: Diagnosis present

## 2014-08-04 DIAGNOSIS — Z01411 Encounter for gynecological examination (general) (routine) with abnormal findings: Secondary | ICD-10-CM | POA: Diagnosis present

## 2014-08-04 DIAGNOSIS — Z8542 Personal history of malignant neoplasm of other parts of uterus: Secondary | ICD-10-CM

## 2014-08-04 DIAGNOSIS — Z90710 Acquired absence of both cervix and uterus: Secondary | ICD-10-CM | POA: Insufficient documentation

## 2014-08-04 DIAGNOSIS — C541 Malignant neoplasm of endometrium: Secondary | ICD-10-CM

## 2014-08-04 NOTE — Patient Instructions (Signed)
We will call you with the results of your pap smear from today.  Plan to follow up in six months or sooner if needed.  Please call for any questions or concerns.

## 2014-08-04 NOTE — Progress Notes (Signed)
Follow Up Note: Gyn-Onc  [name] 63 y.o. female  CC:  Chief Complaint  Patient presents with  . endometrial cancer    HPI:  [name] is a 63 year old who was initially seen in April 2012 as a referral from Dr. Kalman Shan for endometrial cancer identified on an endometrial biopsy in March 2012.  She underwent a TAH-BSO, pelvic and paraaortic lymphadenectomy on Jan 04, 2011. Final pathology showed a grade 3 endometrial cancer with superficial myometrial invasion (stage IA). No adjuvant therapy was recommended at that time.  Interval History:  She presents today for continued follow up.  She states that she has been doing well since her last visit except for one episode of abdominal pain several months ago and an issue with her 2nd toe on her right foot.  She reports several months ago she developed a sharp pain in the right lower abdomen that radiated around to her back.  Pain did not change with movement.  She states she took a laxative, drank more water, and prayed and the pain went away in two to three days.  No pain since that time.  In regards to her foot, she states her toe nail turned black and began to itch so she sought care from a podiatrist.  She had a biopsy taken of the nail bed and was told everything was clear and there was nothing more to do for her.  She was told it most likely occurred due to frequent walking as exercise.  She feels like she should seek a second opinion.  She states that she has had a physician breast examination earlier this year and defers the breast examination today.  She denies any GI or GU symptoms along with no vaginal bleeding or discharge.   Review of Systems  Constitutional: Feels well.  No fever, chills, early satiety, unintentional weight loss or gain, or fatigue.  Cardiovascular: No chest pain, shortness of breath, or edema.  Pulmonary: No cough or wheeze.  Gastrointestinal: No nausea, vomiting, or diarrhea. No bright red blood per rectum or change  in bowel movement.  Genitourinary: No frequency, urgency, or dysuria. No vaginal bleeding or discharge.  Musculoskeletal: No myalgia or joint pain. Neurologic: No weakness, numbness, or change in gait.  Psychology: No depression, anxiety, or insomnia.  Health Maintenance: Mammogram:  12/2013 Pap Smear:01/31/14: normal Colonoscopy:  Up to date per patient Lipid Panel: 2014  Current Meds:  Outpatient Encounter Prescriptions as of 08/04/2014  Medication Sig  . Calcium 1200-1000 MG-UNIT CHEW Chew by mouth daily.    Allergy:  Allergies  Allergen Reactions  . Codeine Nausea Only  . Cytotec [Misoprostol] Nausea Only    Social Hx:   History   Social History  . Marital Status: Single    Spouse Name: N/A    Number of Children: N/A  . Years of Education: N/A   Occupational History  . Not on file.   Social History Main Topics  . Smoking status: Never Smoker   . Smokeless tobacco: Never Used  . Alcohol Use: No     Comment: occasional  . Drug Use: No  . Sexual Activity: Not Currently   Other Topics Concern  . Not on file   Social History Narrative  . No narrative on file    Past Surgical Hx:  Past Surgical History  Procedure Laterality Date  . Toe surgery      Bil feet  . Abdominal hysterectomy  5/12    TAHBSO pelvic  and paraaortic LND    Past Medical Hx:  Past Medical History  Diagnosis Date  . Obesity   . Cancer 5/12    gr 3 stg 1A    Family Hx:  Family History  Problem Relation Age of Onset  . Diabetes Father   . Lung cancer Sister   . Uterine cancer Sister     Vitals:  Blood pressure 132/76, pulse 75, temperature 98.2 F (36.8 C), temperature source Oral, resp. rate 22, height 5\' 7"  (1.702 m), weight 230 lb 1.6 oz (104.373 kg).  Physical Exam:  General: Well developed, well nourished female in no acute distress. Alert and oriented x 3.  Head/Neck: Sclerae anicteric.  Oropharynx clear with no lesions.  Supple without any enlargements.  Lymph  node survey: No cervical, supraclavicular, or inguinal adenopathy.  Cardiovascular: Regular rate and rhythm. S1 and S2 normal.  Lungs: Clear to auscultation bilaterally. No wheezes/crackles/rhonchi noted.  Skin: No rashes or lesions present. Back: No CVA tenderness.  Abdomen: Abdomen soft, non-tender and obese. Active bowel sounds in all quadrants. No evidence of a fluid wave or abdominal masses.  Genitourinary:    Vulva/vagina: Normal external female genitalia. No lesions.    Urethra: No lesions or masses.    Vagina: Atrophic without any lesions. No palpable masses. No vaginal bleeding or drainage noted.  ThinPrep pap obtained without difficulty.  Rectal: Good tone, no masses, no cul de sac nodularity.  Extremities: No bilateral cyanosis, edema, or clubbing.  2nd toe of the right foot darkened with no drainage or erythema.  No edema around the nail or nailbed.   Assessment/Plan:   [name] is a 63 year old who underwent a TAH-BSO, pelvic and paraaortic lymphadenectomy on Jan 04, 2011 for a grade 3 endometrial cancer with superficial myometrial invasion (stage IA).  No adjuvant therapy was recommended.  She is doing well and is without evidence of disease at this time.  Again, it is recommended for her to obtain a primary care provider. We will contact her with the results of her pap smear from today.  She is advised to follow up in six months or sooner if needed.  Reportable signs and symptoms reviewed.  She is to call the office if the abdominal pain develops again or for any concerns/questions.      Jasmine Angelica DEAL, NP 08/04/2014, 11:07 AM

## 2014-08-05 LAB — CYTOLOGY - PAP

## 2014-08-08 ENCOUNTER — Telehealth: Payer: Self-pay | Admitting: *Deleted

## 2014-08-08 NOTE — Telephone Encounter (Signed)
-----   Message from Dorothyann Gibbs, NP sent at 08/07/2014  8:13 AM EST ----- Please let her know that her pap smear is normal.  Thank you  ----- Message -----    From: Lab in [facility]    Sent: 08/06/2014   9:40 AM      To: Dorothyann Gibbs, NP

## 2014-08-08 NOTE — Telephone Encounter (Signed)
Called and left VM for patient letting her know pap was normal and to call the office if she has any further questions or concerns.

## 2014-08-15 ENCOUNTER — Ambulatory Visit: Payer: No Typology Code available for payment source | Admitting: Gynecologic Oncology

## 2014-11-17 ENCOUNTER — Other Ambulatory Visit: Payer: Self-pay | Admitting: Gynecologic Oncology

## 2014-11-17 DIAGNOSIS — R21 Rash and other nonspecific skin eruption: Secondary | ICD-10-CM

## 2014-11-17 MED ORDER — MICONAZOLE NITRATE 2 % EX CREA: 1.0000 "application " | TOPICAL_CREAM | Freq: Two times a day (BID) | CUTANEOUS | Status: DC

## 2014-11-17 NOTE — Progress Notes (Signed)
Patient called reporting vaginal itching with a rash-like area.  She reports having moderate sweating in the vulvar area and has started to use vagisil and also placed peroxide on the rash.  Informed to stop using vagisil and peroxide and to use a mild soap to the area and keep the area clean and dry.  External miconazole cream sent to patient's pharmacy.  Advised to call if the rash persists or worsens.  Advised to call for any questions or concerns.

## 2014-11-24 ENCOUNTER — Other Ambulatory Visit: Payer: Self-pay | Admitting: Gynecology

## 2014-11-24 DIAGNOSIS — Z1231 Encounter for screening mammogram for malignant neoplasm of breast: Secondary | ICD-10-CM

## 2014-12-31 ENCOUNTER — Telehealth: Payer: Self-pay | Admitting: *Deleted

## 2014-12-31 NOTE — Telephone Encounter (Signed)
Patient called requesting to reschedule appt with Joylene John, NP to June 2016. Patient given appt for 02/12/15 at 9:30am. Patient agreeable to new appt date & time.

## 2015-01-01 ENCOUNTER — Ambulatory Visit (HOSPITAL_COMMUNITY)
Admission: RE | Admit: 2015-01-01 | Discharge: 2015-01-01 | Disposition: A | Discharge status: Home / Self Care | Payer: Medicare HMO | Source: Ambulatory Visit | Attending: Gynecology | Admitting: Gynecology

## 2015-01-01 DIAGNOSIS — Z1231 Encounter for screening mammogram for malignant neoplasm of breast: Secondary | ICD-10-CM | POA: Insufficient documentation

## 2015-01-08 ENCOUNTER — Ambulatory Visit: Payer: Medicare Other | Admitting: Gynecologic Oncology

## 2015-02-12 ENCOUNTER — Ambulatory Visit: Payer: Medicare HMO | Attending: Gynecologic Oncology | Admitting: Gynecologic Oncology

## 2015-02-12 ENCOUNTER — Encounter: Payer: Self-pay | Admitting: Gynecologic Oncology

## 2015-02-12 ENCOUNTER — Other Ambulatory Visit (HOSPITAL_COMMUNITY)
Admission: RE | Admit: 2015-02-12 | Discharge: 2015-02-12 | Disposition: A | Discharge status: Home / Self Care | Payer: Medicare HMO | Source: Ambulatory Visit | Attending: Gynecology | Admitting: Gynecology

## 2015-02-12 VITALS — BP 143/73 | HR 62 | Temp 98.0°F | Resp 20 | Ht 67.0 in | Wt 229.0 lb

## 2015-02-12 DIAGNOSIS — Z8542 Personal history of malignant neoplasm of other parts of uterus: Secondary | ICD-10-CM | POA: Diagnosis not present

## 2015-02-12 DIAGNOSIS — Z90710 Acquired absence of both cervix and uterus: Secondary | ICD-10-CM

## 2015-02-12 DIAGNOSIS — C541 Malignant neoplasm of endometrium: Secondary | ICD-10-CM | POA: Insufficient documentation

## 2015-02-12 DIAGNOSIS — Z01411 Encounter for gynecological examination (general) (routine) with abnormal findings: Secondary | ICD-10-CM | POA: Insufficient documentation

## 2015-02-12 NOTE — Progress Notes (Signed)
Follow Up Note: Gyn-Onc  [name] 64 y.o. female  CC:  Chief Complaint  Patient presents with  . endometrial cancer    follow-up    HPI:  [name] is a 64 year old who was initially seen in April 2012 as a referral from Dr. Kalman Moore for endometrial cancer identified on an endometrial biopsy in March 2012.  She underwent a TAH-BSO, pelvic and paraaortic lymphadenectomy on Jan 04, 2011. Final pathology showed a grade 3 endometrial cancer with superficial myometrial invasion (stage IA). No adjuvant therapy was recommended at that time.  Interval History:  She presents today for continued follow up.  She states that she has been doing well since her last visit.  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  She denies any GI or GU symptoms along with no vaginal bleeding or discharge.  Reporting mild vulvar irritation but relief with the use of monistat cream.  Tolerating diet with no nausea or emesis.  She states she does not think her toenail will change back to the normal color but she continues to use the medication given to her.  She has not found a PCP and states she has a hard time trusting people.  Advised of the need for well visits, cholesterol screenings, and other preventative screenings/care but she states she did feel it was right for her.  Refusing lab work with cholesterol check this am even though she was fasting.  Also refusing to have breast examination since she "just had a mammogram."  No other concerns voiced.    Review of Systems  Constitutional: Feels well.  No fever, chills, early satiety, unintentional weight loss or gain, or fatigue.  Cardiovascular: No chest pain, shortness of breath, or edema.  Pulmonary: No cough or wheeze.  Gastrointestinal: No nausea, vomiting, or diarrhea. No bright red blood per rectum or change in bowel movement.  Genitourinary: No frequency, urgency, or dysuria. No vaginal  bleeding or discharge.  Musculoskeletal: No myalgia or joint pain. Neurologic: No weakness, numbness, or change in gait.  Psychology: No depression, anxiety, or insomnia.  Health Maintenance: Mammogram:  12/2014 Pap Smear:08/04/14: normal Colonoscopy:  Up to date per patient Lipid Panel: 2014  Current Meds:  Outpatient Encounter Prescriptions as of 02/12/2015  Medication Sig  . Calcium Carbonate-Vit D-Min (CALCIUM 1200 PO) Take 1 tablet by mouth daily.  . miconazole (MICOTIN) 2 % cream Apply 1 application topically 2 (two) times daily.  . vitamin C (ASCORBIC ACID) 500 MG tablet Take 500 mg by mouth daily.  . [DISCONTINUED] Calcium 1200-1000 MG-UNIT CHEW Chew by mouth daily.   No facility-administered encounter medications on file as of 02/12/2015.    Allergy:  Allergies  Allergen Reactions  . Codeine Nausea Only  . Cytotec [Misoprostol] Nausea Only    Social Hx:   History   Social History  . Marital Status: Single    Spouse Name: N/A  . Number of Children: N/A  . Years of Education: N/A   Occupational History  . Not on file.   Social History Main Topics  . Smoking status: Never Smoker   . Smokeless tobacco: Never Used  . Alcohol Use: No     Comment: occasional  . Drug Use: No  . Sexual Activity: Not Currently   Other Topics Concern  . Not on file   Social History Narrative    Past Surgical Hx:  Past Surgical History  Procedure Laterality  Date  . Toe surgery      Bil feet  . Abdominal hysterectomy  5/12    TAHBSO pelvic and paraaortic LND    Past Medical Hx:  Past Medical History  Diagnosis Date  . Obesity   . Cancer 5/12    gr 3 stg 1A    Family Hx:  Family History  Problem Relation Age of Onset  . Diabetes Father   . Lung cancer Sister   . Uterine cancer Sister     Vitals:  Blood pressure 143/73, pulse 62, temperature 98 F (36.7 C), temperature source Oral, resp. rate 20, height 5\' 7"  (1.702 m), weight 229 lb (103.874 kg).  Physical  Exam:  General: Well developed, well nourished female in no acute distress. Alert and oriented x 3.  Head/Neck: Sclerae anicteric.  Oropharynx clear with no lesions.  Supple without any enlargements.  Lymph node survey: No cervical, supraclavicular, or inguinal adenopathy.  Cardiovascular: Regular rate and rhythm. S1 and S2 normal.  Lungs: Clear to auscultation bilaterally. No wheezes/crackles/rhonchi noted.  Skin: No rashes or lesions present. Back: No CVA tenderness.  Abdomen: Abdomen soft, non-tender and obese. Active bowel sounds in all quadrants. No evidence of a fluid wave or abdominal masses.  Genitourinary:    Vulva/vagina: Normal external female genitalia. No lesions.    Urethra: No lesions or masses.    Vagina: Atrophic without any lesions. No palpable masses. No vaginal bleeding or drainage noted.  ThinPrep pap obtained without difficulty.  Rectal: Good tone, no masses, no cul de sac nodularity.  Extremities: No bilateral cyanosis, edema, or clubbing.  2nd toe of the right foot darkened with no drainage or erythema.  No edema around the nail or nailbed.   Assessment/Plan:   [name] is a 64 year old who underwent a TAH-BSO, pelvic and paraaortic lymphadenectomy on Jan 04, 2011 for a grade 3 endometrial cancer with superficial myometrial invasion (stage IA).  No adjuvant therapy was recommended.  She is doing well and is without evidence of disease at this time.  Again, it is recommended for her to obtain a primary care provider. We will contact her with the results of her pap smear from today.  She is advised to follow up in six months or sooner if needed.  Reportable signs and symptoms reviewed.  She is to call the office if the abdominal pain develops again or for any concerns/questions.      CROSS, MELISSA DEAL, NP 02/12/2015, 12:27 PM

## 2015-02-12 NOTE — Patient Instructions (Signed)
Doing great.  No evidence of recurrence on examination.  We will contact you with the results of your pap smear from today.  We will see you in six months or sooner if needed.  Please call for any questions or concerns.

## 2015-02-16 LAB — CYTOLOGY - PAP

## 2015-02-17 ENCOUNTER — Telehealth: Payer: Self-pay | Admitting: *Deleted

## 2015-02-17 NOTE — Telephone Encounter (Signed)
-----   Message from Dorothyann Gibbs, NP sent at 02/17/2015  9:36 AM EDT ----- Please let her know her pap smear is normal. Thanks  ----- Message -----    From: Lab [facility]    Sent: 02/16/2015   3:37 PM      To: Dorothyann Gibbs, NP

## 2015-02-17 NOTE — Telephone Encounter (Signed)
Unable to reach pt, lmovm for pt pap smear normal.

## 2015-08-13 ENCOUNTER — Ambulatory Visit: Payer: Medicare HMO | Attending: Gynecologic Oncology | Admitting: Gynecologic Oncology

## 2015-08-13 ENCOUNTER — Encounter: Payer: Self-pay | Admitting: Gynecologic Oncology

## 2015-08-13 VITALS — BP 134/85 | HR 75 | Temp 98.0°F | Resp 19 | Ht 67.0 in | Wt 236.6 lb

## 2015-08-13 DIAGNOSIS — C541 Malignant neoplasm of endometrium: Secondary | ICD-10-CM | POA: Diagnosis not present

## 2015-08-13 NOTE — Patient Instructions (Signed)
Doing well.  Plan to follow up in six months with a pap smear at that time or sooner if needed.  Please call for any new symptoms including vaginal bleeding/spotting, abdominal symptoms.  Please call in Jan 2017 to schedule your appointment for six months (June 2017)

## 2015-08-13 NOTE — Progress Notes (Signed)
Follow Up Note: Gyn-Onc  [name] 64 y.o. female  CC:  Chief Complaint  Patient presents with  . Endometrial Cancer    Follow up    HPI:  [name] is a 64 year old who was initially seen in April 2012 as a referral from Dr. Kalman Shan for endometrial cancer identified on an endometrial biopsy in March 2012.  She underwent a TAH-BSO, pelvic and paraaortic lymphadenectomy on Jan 04, 2011. Final pathology showed a grade 3 endometrial cancer with superficial myometrial invasion (stage IA). No adjuvant therapy was recommended at that time.  Interval History:  She presents today for continued follow up.  She states that she has been doing well since her last visit.  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  She denies any GI or GU symptoms along with no vaginal bleeding or discharge.  Tolerating diet with no nausea or emesis.  She reports no issues with her toenail discoloration since she keeps the nail trimmed.  She continues to report that she has not found a PCP.  Once again, advised of the need for well visits, cholesterol screenings, and other preventative screenings/care but she states she does not feel it was right for her.  Refusing lab work with cholesterol check this am along with breast examination since she "just had a mammogram."  No other concerns voiced.    Review of Systems  Constitutional: Feels well.  No fever, chills, early satiety, unintentional weight loss or gain, or fatigue.  Cardiovascular: No chest pain, shortness of breath, or edema.  Pulmonary: No cough or wheeze.  Gastrointestinal: No nausea, vomiting, or diarrhea. No bright red blood per rectum or change in bowel movement.  Genitourinary: No frequency, urgency, or dysuria. No vaginal bleeding or discharge.  Musculoskeletal: No myalgia or joint pain. Neurologic: No weakness, numbness, or change in gait.  Psychology: No depression, anxiety, or insomnia.  Health  Maintenance: Mammogram:  12/2014 Pap Smear:02/12/15: normal Colonoscopy:  Up to date per patient Lipid Panel: 2014  Current Meds:  Outpatient Encounter Prescriptions as of 08/13/2015  Medication Sig  . Calcium Carbonate-Vit D-Min (CALCIUM 1200 PO) Take 1 tablet by mouth daily.  . miconazole (MICOTIN) 2 % cream Apply 1 application topically 2 (two) times daily.  . vitamin C (ASCORBIC ACID) 500 MG tablet Take 500 mg by mouth daily.   No facility-administered encounter medications on file as of 08/13/2015.    Allergy:  Allergies  Allergen Reactions  . Codeine Nausea Only  . Cytotec [Misoprostol] Nausea Only    Social Hx:   Social History   Social History  . Marital Status: Single    Spouse Name: N/A  . Number of Children: N/A  . Years of Education: N/A   Occupational History  . Not on file.   Social History Main Topics  . Smoking status: Never Smoker   . Smokeless tobacco: Never Used  . Alcohol Use: No     Comment: occasional  . Drug Use: No  . Sexual Activity: Not Currently   Other Topics Concern  . Not on file   Social History Narrative    Past Surgical Hx:  Past Surgical History  Procedure Laterality Date  . Toe surgery      Bil feet  . Abdominal hysterectomy  5/12    TAHBSO pelvic and paraaortic LND    Past Medical Hx:  Past Medical History  Diagnosis Date  . Obesity   . Cancer 5/12  gr 3 stg 1A    Family Hx:  Family History  Problem Relation Age of Onset  . Diabetes Father   . Lung cancer Sister   . Uterine cancer Sister     Vitals:  Blood pressure 134/85, pulse 75, temperature 98 F (36.7 C), temperature source Oral, resp. rate 19, height 5\' 7"  (1.702 m), weight 236 lb 9.6 oz (107.321 kg), SpO2 100 %.  Physical Exam:  General: Well developed, well nourished female in no acute distress. Alert and oriented x 3.  Head/Neck: Sclerae anicteric.  Oropharynx clear with no lesions.  Supple without any enlargements.  Lymph node survey: No  cervical, supraclavicular, or inguinal adenopathy.  Cardiovascular: Regular rate and rhythm. S1 and S2 normal.  Lungs: Clear to auscultation bilaterally. No wheezes/crackles/rhonchi noted.  Skin: No rashes or lesions present. Back: No CVA tenderness.  Abdomen: Abdomen soft, non-tender and obese. Active bowel sounds in all quadrants. No evidence of a fluid wave or abdominal masses.  Genitourinary:    Vulva/vagina: Normal external female genitalia. No lesions.    Urethra: No lesions or masses.    Vagina: Atrophic without any lesions. No palpable masses. No vaginal bleeding or drainage noted.   Rectal: Good tone, no masses, no cul de sac nodularity.  Extremities: No bilateral cyanosis, edema, or clubbing.    Assessment/Plan:   [name] is a 64 year old who underwent a TAH-BSO, pelvic and paraaortic lymphadenectomy on Jan 04, 2011 for a grade 3 endometrial cancer with superficial myometrial invasion (stage IA).  No adjuvant therapy was recommended.  She is doing well and is without evidence of disease at this time.  Again, it is recommended for her to obtain a primary care provider.  She is advised to follow up in six months or sooner if needed.  Reportable signs and symptoms reviewed.  She is to call the office for any concerns/questions.      CROSS, MELISSA DEAL, NP 08/13/2015, 9:22 AM

## 2015-09-14 DIAGNOSIS — H25013 Cortical age-related cataract, bilateral: Secondary | ICD-10-CM | POA: Diagnosis not present

## 2015-09-14 DIAGNOSIS — H2513 Age-related nuclear cataract, bilateral: Secondary | ICD-10-CM | POA: Diagnosis not present

## 2015-09-14 DIAGNOSIS — H40013 Open angle with borderline findings, low risk, bilateral: Secondary | ICD-10-CM | POA: Diagnosis not present

## 2015-12-21 ENCOUNTER — Other Ambulatory Visit: Payer: Self-pay

## 2015-12-21 DIAGNOSIS — Z1231 Encounter for screening mammogram for malignant neoplasm of breast: Secondary | ICD-10-CM

## 2016-01-04 ENCOUNTER — Ambulatory Visit: Payer: Medicare HMO

## 2016-01-06 DIAGNOSIS — H40013 Open angle with borderline findings, low risk, bilateral: Secondary | ICD-10-CM | POA: Diagnosis not present

## 2016-01-06 DIAGNOSIS — H524 Presbyopia: Secondary | ICD-10-CM | POA: Diagnosis not present

## 2016-01-15 DIAGNOSIS — Z0100 Encounter for examination of eyes and vision without abnormal findings: Secondary | ICD-10-CM | POA: Diagnosis not present

## 2016-02-04 ENCOUNTER — Ambulatory Visit: Payer: Medicare HMO | Admitting: Gynecologic Oncology

## 2016-02-05 ENCOUNTER — Encounter: Payer: Self-pay | Admitting: Gynecologic Oncology

## 2016-02-05 ENCOUNTER — Ambulatory Visit: Payer: Medicare HMO | Attending: Gynecologic Oncology | Admitting: Gynecologic Oncology

## 2016-02-05 ENCOUNTER — Other Ambulatory Visit (HOSPITAL_COMMUNITY)
Admission: RE | Admit: 2016-02-05 | Discharge: 2016-02-05 | Disposition: A | Discharge status: Home / Self Care | Payer: Medicare HMO | Source: Ambulatory Visit | Attending: Gynecologic Oncology | Admitting: Gynecologic Oncology

## 2016-02-05 VITALS — BP 140/70 | HR 75 | Temp 97.8°F | Resp 18 | Ht 67.0 in

## 2016-02-05 DIAGNOSIS — Z01411 Encounter for gynecological examination (general) (routine) with abnormal findings: Secondary | ICD-10-CM | POA: Diagnosis not present

## 2016-02-05 DIAGNOSIS — C541 Malignant neoplasm of endometrium: Secondary | ICD-10-CM

## 2016-02-05 DIAGNOSIS — Z90710 Acquired absence of both cervix and uterus: Secondary | ICD-10-CM

## 2016-02-05 DIAGNOSIS — E669 Obesity, unspecified: Secondary | ICD-10-CM | POA: Diagnosis not present

## 2016-02-05 DIAGNOSIS — Z08 Encounter for follow-up examination after completed treatment for malignant neoplasm: Secondary | ICD-10-CM | POA: Insufficient documentation

## 2016-02-05 DIAGNOSIS — Z8542 Personal history of malignant neoplasm of other parts of uterus: Secondary | ICD-10-CM | POA: Diagnosis not present

## 2016-02-05 NOTE — Progress Notes (Signed)
Follow Up Note: Gyn-Onc  [name] 65 y.o. female  CC:  Chief Complaint  Patient presents with  . Endometrial Cancer    Follow up    HPI:  [name] is a 65 year old who was initially seen in April 2012 as a referral from Dr. Kalman Moore for endometrial cancer identified on an endometrial biopsy in March 2012.  She underwent a TAH-BSO, pelvic and paraaortic lymphadenectomy on Jan 04, 2011. Final pathology showed a grade 3 endometrial cancer with superficial myometrial invasion (stage IA). No adjuvant therapy was recommended at that time.  Interval History:  She presents today for continued follow up.  She states that she has been doing well since her last visit.  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  She denies any GI or GU symptoms along with no vaginal bleeding or discharge.  Tolerating diet with no nausea or emesis.  When asked about obtaining a PCP, she stated "I don't want that."  Advised of the need for well visits, cholesterol screenings, and other preventative screenings/care but she states she does not feel it was right for her.  Refusing lab work with cholesterol check this am along with breast examination since she "will have a mammogram."  No other concerns voiced.    Review of Systems  Constitutional: Feels well.  No fever, chills, early satiety, unintentional weight loss or gain, or fatigue.  Cardiovascular: No chest pain, shortness of breath, or edema.  Pulmonary: No cough or wheeze.  Gastrointestinal: No nausea, vomiting, or diarrhea. No bright red blood per rectum or change in bowel movement.  Genitourinary: No frequency, urgency, or dysuria. No vaginal bleeding or discharge.  Musculoskeletal: No myalgia or joint pain. Neurologic: No weakness, numbness, or change in gait.  Psychology: No depression, anxiety, or insomnia.  Health Maintenance: Mammogram:  12/2014.  Stating there was a scheduling conflict in May and she is going to reschedule mammo  for this mth. Pap Smear:02/12/15: normal Colonoscopy:  Up to date per patient.  Stating she was told 8 years after her previous Lipid Panel: 2014  Current Meds:  Outpatient Encounter Prescriptions as of 02/05/2016  Medication Sig  . Calcium Carbonate-Vit D-Min (CALCIUM 1200 PO) Take 1 tablet by mouth daily.  . miconazole (MICOTIN) 2 % cream Apply 1 application topically 2 (two) times daily.  . vitamin C (ASCORBIC ACID) 500 MG tablet Take 500 mg by mouth daily.   No facility-administered encounter medications on file as of 02/05/2016.    Allergy:  Allergies  Allergen Reactions  . Codeine Nausea Only  . Cytotec [Misoprostol] Nausea Only    Social Hx:   Social History   Social History  . Marital Status: Single    Spouse Name: N/A  . Number of Children: N/A  . Years of Education: N/A   Occupational History  . Not on file.   Social History Main Topics  . Smoking status: Never Smoker   . Smokeless tobacco: Never Used  . Alcohol Use: No     Comment: occasional  . Drug Use: No  . Sexual Activity: Not Currently   Other Topics Concern  . Not on file   Social History Narrative    Past Surgical Hx:  Past Surgical History  Procedure Laterality Date  . Toe surgery      Bil feet  . Abdominal hysterectomy  5/12    TAHBSO pelvic and paraaortic LND    Past Medical Hx:  Past Medical History  Diagnosis  Date  . Obesity   . Cancer ([place]) 5/12    gr 3 stg 1A    Family Hx:  Family History  Problem Relation Age of Onset  . Diabetes Father   . Lung cancer Sister   . Uterine cancer Sister     Vitals:  Blood pressure 140/70, pulse 75, temperature 97.8 F (36.6 C), temperature source Oral, resp. rate 18, height 5\' 7"  (1.702 m), SpO2 99 %.  Physical Exam:  General: Well developed, well nourished female in no acute distress. Alert and oriented x 3.  Head/Neck: Sclerae anicteric.  Oropharynx clear with no lesions.  Supple without any enlargements.  Lymph node survey: No  cervical, supraclavicular, or inguinal adenopathy.  Cardiovascular: Regular rate and rhythm. S1 and S2 normal.  Lungs: Clear to auscultation bilaterally. No wheezes/crackles/rhonchi noted.  Skin: No rashes or lesions present. Back: No CVA tenderness.  Abdomen: Abdomen soft, non-tender and obese. Active bowel sounds in all quadrants. No evidence of a fluid wave or abdominal masses.  Genitourinary:    Vulva/vagina: Normal external female genitalia. No lesions.    Urethra: No lesions or masses.    Vagina: Atrophic without any lesions. No palpable masses. No vaginal bleeding or drainage noted.  ThinPrep pap obtained without difficulty.    Rectal: Good tone, no masses, no cul de sac nodularity.  Extremities: No bilateral cyanosis, edema, or clubbing.    Assessment/Plan:   [name] is a 65 year old who underwent a TAH-BSO, pelvic and paraaortic lymphadenectomy on Jan 04, 2011 for a grade 3 endometrial cancer with superficial myometrial invasion (stage IA).  No adjuvant therapy was recommended.  She is doing well and is without evidence of disease at this time.  We will contact her with the results of her pap smear from today.  She is advised of the importance of having a primary care provider but refusing at this time.  She is advised to follow up in six months or sooner if needed.  Reportable signs and symptoms reviewed.  She is to call the office for any concerns/questions.      Jasmine Zeiss DEAL, NP 02/05/2016, 1:25 PM

## 2016-02-05 NOTE — Patient Instructions (Signed)
We will contact you with the results of your pap smear from today.  Plan to follow up in six months or sooner if needed.  Please call for any questions or concerns.

## 2016-02-10 ENCOUNTER — Telehealth: Payer: Self-pay

## 2016-02-10 LAB — CYTOLOGY - PAP

## 2016-02-10 NOTE — Telephone Encounter (Signed)
Orders received from [place] to contact the patient to update with PAP results being "normal" collected on 06/-10/2015 . Patient was contacted and updated with normal results , patient states understanding , denies further questions at this time.

## 2016-04-22 ENCOUNTER — Other Ambulatory Visit: Payer: Self-pay

## 2016-04-22 DIAGNOSIS — Z1231 Encounter for screening mammogram for malignant neoplasm of breast: Secondary | ICD-10-CM

## 2016-05-03 ENCOUNTER — Ambulatory Visit
Admission: RE | Admit: 2016-05-03 | Discharge: 2016-05-03 | Disposition: A | Discharge status: Home / Self Care | Payer: Medicare HMO | Source: Ambulatory Visit

## 2016-05-03 DIAGNOSIS — Z1231 Encounter for screening mammogram for malignant neoplasm of breast: Secondary | ICD-10-CM | POA: Diagnosis not present

## 2016-08-16 ENCOUNTER — Telehealth: Payer: Self-pay | Admitting: Nurse Practitioner

## 2016-08-16 NOTE — Telephone Encounter (Signed)
Cancelled apt for 12/13 per Lenna Sciara, NP. Patient will reschedule with her once we know when provider will return to clinic.

## 2016-08-17 ENCOUNTER — Ambulatory Visit: Payer: Medicare HMO | Admitting: Gynecologic Oncology

## 2016-08-19 NOTE — Telephone Encounter (Signed)
Called pt, unable to reach lmovm advised pt can be seen with NP 12/29. Pt to call office to confirm appt time.

## 2016-08-23 ENCOUNTER — Telehealth: Payer: Self-pay | Admitting: Nurse Practitioner

## 2016-08-23 NOTE — Telephone Encounter (Signed)
Patient needing to reschedule apt with Joylene John, NP; left message to call back.

## 2016-08-23 NOTE — Telephone Encounter (Signed)
Patient returning call to schedule f/u apt with Joylene John, NP. Patient is out of town of 09/02/16, specific date Lenna Sciara opened for pt. Patient will call back after the holidays to reschedule, as she is going on 2 separate trips and does not know when she will return.

## 2016-09-16 ENCOUNTER — Encounter: Payer: Self-pay | Admitting: Gastroenterology

## 2016-09-19 DIAGNOSIS — H2513 Age-related nuclear cataract, bilateral: Secondary | ICD-10-CM | POA: Diagnosis not present

## 2016-09-19 DIAGNOSIS — H40013 Open angle with borderline findings, low risk, bilateral: Secondary | ICD-10-CM | POA: Diagnosis not present

## 2016-09-19 DIAGNOSIS — H35033 Hypertensive retinopathy, bilateral: Secondary | ICD-10-CM | POA: Diagnosis not present

## 2016-09-19 DIAGNOSIS — H25013 Cortical age-related cataract, bilateral: Secondary | ICD-10-CM | POA: Diagnosis not present

## 2016-09-20 ENCOUNTER — Telehealth: Payer: Self-pay | Admitting: Gynecologic Oncology

## 2016-09-20 NOTE — Telephone Encounter (Signed)
Left message for patient.  She had stopped by the office earlier and left a note asking for me to contact her.  Advised her to please call the office to discuss her concerns.

## 2016-09-26 ENCOUNTER — Encounter: Payer: Self-pay | Admitting: Gynecologic Oncology

## 2016-09-26 ENCOUNTER — Ambulatory Visit: Payer: Medicare HMO | Attending: Gynecologic Oncology | Admitting: Gynecologic Oncology

## 2016-09-26 VITALS — BP 123/64 | HR 74 | Temp 98.0°F | Resp 18 | Ht 67.0 in

## 2016-09-26 DIAGNOSIS — Z8542 Personal history of malignant neoplasm of other parts of uterus: Secondary | ICD-10-CM | POA: Diagnosis not present

## 2016-09-26 DIAGNOSIS — Z08 Encounter for follow-up examination after completed treatment for malignant neoplasm: Secondary | ICD-10-CM | POA: Insufficient documentation

## 2016-09-26 DIAGNOSIS — C541 Malignant neoplasm of endometrium: Secondary | ICD-10-CM

## 2016-09-26 DIAGNOSIS — E669 Obesity, unspecified: Secondary | ICD-10-CM | POA: Diagnosis not present

## 2016-09-26 NOTE — Progress Notes (Signed)
Follow Up Note: Gyn-Onc  [name] 66 y.o. female  CC:  Chief Complaint  Patient presents with  . Endometrial Cancer    Follow up    HPI:  [name] is a 66 year old who was initially seen in April 2012 as a referral from Dr. Kalman Shan for endometrial cancer identified on an endometrial biopsy in March 2012.  She underwent a TAH-BSO, pelvic and paraaortic lymphadenectomy on Jan 04, 2011. Final pathology showed a grade 3 endometrial cancer with superficial myometrial invasion (stage IA). No adjuvant therapy was recommended at that time.  Interval History:  She presents today for continued follow up.  She states that she has been doing well since her last visit.  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  She denies any GI or GU symptoms along with no vaginal bleeding or discharge.  Tolerating diet with no nausea or emesis.  Still has not obtained a PCP.  She has seen an opthalmologist due to a new "floater" in her right eye with no additional work up needed.  She also states she just received a reminder about having her colonoscopy.  Continuing to refuse lab work with cholesterol check this am along with breast examination since she "will have a mammogram."  No other concerns voiced.    Review of Systems  Constitutional: Feels well.  No fever, chills, early satiety, unintentional weight loss or gain, or fatigue.  Cardiovascular: No chest pain, shortness of breath, or edema.  Pulmonary: No cough or wheeze.  Gastrointestinal: No nausea, vomiting, or diarrhea. No bright red blood per rectum or change in bowel movement.  Genitourinary: No frequency, urgency, or dysuria. No vaginal bleeding or discharge.  Musculoskeletal: No myalgia or joint pain. Neurologic: No weakness, numbness, or change in gait.  Psychology: No depression, anxiety, or insomnia.  Health Maintenance: Mammogram:  04/2016. Pap Smear:02/05/16: normal Colonoscopy:  Up to date per patient but due to  schedule soon Lipid Panel: 2014  Current Meds:  Outpatient Encounter Prescriptions as of 09/26/2016  Medication Sig Dispense Refill  . Calcium Carbonate-Vit D-Min (CALCIUM 1200 PO) Take 1 tablet by mouth daily.    . miconazole (MICOTIN) 2 % cream Apply 1 application topically 2 (two) times daily. 28.35 g 0  . vitamin C (ASCORBIC ACID) 500 MG tablet Take 500 mg by mouth daily.     No facility-administered encounter medications on file as of 09/26/2016.     Allergy:  Allergies  Allergen Reactions  . Codeine Nausea Only  . Cytotec [Misoprostol] Nausea Only    Social Hx:   Social History   Social History  . Marital status: Single    Spouse name: N/A  . Number of children: N/A  . Years of education: N/A   Occupational History  . Not on file.   Social History Main Topics  . Smoking status: Never Smoker  . Smokeless tobacco: Never Used  . Alcohol use No     Comment: occasional  . Drug use: No  . Sexual activity: Not Currently   Other Topics Concern  . Not on file   Social History Narrative  . No narrative on file    Past Surgical Hx:  Past Surgical History:  Procedure Laterality Date  . ABDOMINAL HYSTERECTOMY  5/12   TAHBSO pelvic and paraaortic LND  . TOE SURGERY     Bil feet    Past Medical Hx:  Past Medical History:  Diagnosis Date  . Cancer ([place]) 5/12  gr 3 stg 1A  . Obesity     Family Hx:  Family History  Problem Relation Age of Onset  . Diabetes Father   . Lung cancer Sister   . Uterine cancer Sister     Vitals:  Blood pressure 123/64, pulse 74, temperature 98 F (36.7 C), temperature source Oral, resp. rate 18, height 5\' 7"  (1.702 m), SpO2 100 %.  Physical Exam:  General: Well developed, well nourished female in no acute distress. Alert and oriented x 3.  Head/Neck: Sclerae anicteric.  Oropharynx clear with no lesions.  Supple without any enlargements.  Lymph node survey: No cervical, supraclavicular, or inguinal adenopathy.   Cardiovascular: Regular rate and rhythm. S1 and S2 normal.  Lungs: Clear to auscultation bilaterally. No wheezes/crackles/rhonchi noted.  Skin: No rashes or lesions present. Back: No CVA tenderness.  Abdomen: Abdomen soft, non-tender and obese. Active bowel sounds in all quadrants. No evidence of a fluid wave or abdominal masses.  Genitourinary:    Vulva/vagina: Normal external female genitalia. No lesions.    Urethra: No lesions or masses.    Vagina: Atrophic without any lesions. No palpable masses. No vaginal bleeding or drainage noted.   Rectal: Good tone, no masses, no cul de sac nodularity.  Extremities: No bilateral cyanosis, edema, or clubbing.    Assessment/Plan:   [name] is a 66 year old who underwent a TAH-BSO, pelvic and paraaortic lymphadenectomy on Jan 04, 2011 for a grade 3 endometrial cancer with superficial myometrial invasion (stage IA).  No adjuvant therapy was recommended.  She is doing well and is without evidence of disease at this time.    She is advised of the importance of having a primary care provider but refusing at this time.  She is advised that she can follow up in one year but she would like to be seen in six months or sooner if needed.  Reportable signs and symptoms reviewed.  She is to call the office for any concerns/questions.      Jasmine Claar DEAL, NP 09/26/2016, 2:09 PM

## 2016-09-26 NOTE — Patient Instructions (Signed)
Doing great!  Plan to follow up in July 2018 or sooner if needed.  Please call for any questions or concerns.

## 2016-10-13 ENCOUNTER — Telehealth: Payer: Self-pay | Admitting: Gastroenterology

## 2016-10-14 ENCOUNTER — Telehealth: Payer: Self-pay | Admitting: Gastroenterology

## 2016-10-14 NOTE — Telephone Encounter (Signed)
Check the pathology report and the recall time in the result letter. The correct recall date per Dr Olevia Perches is 20/2018. Called to the patient and got her voicemail. I left this information on her voicemail in detail.

## 2016-10-14 NOTE — Telephone Encounter (Signed)
Made in error

## 2017-02-07 DIAGNOSIS — Z0101 Encounter for examination of eyes and vision with abnormal findings: Secondary | ICD-10-CM | POA: Diagnosis not present

## 2017-03-20 DIAGNOSIS — H40013 Open angle with borderline findings, low risk, bilateral: Secondary | ICD-10-CM | POA: Diagnosis not present

## 2017-03-27 ENCOUNTER — Ambulatory Visit: Payer: Medicare HMO | Attending: Gynecologic Oncology | Admitting: Gynecologic Oncology

## 2017-03-27 ENCOUNTER — Encounter: Payer: Self-pay | Admitting: Gynecologic Oncology

## 2017-03-27 VITALS — BP 131/60 | HR 72 | Temp 98.2°F | Resp 18 | Ht 67.0 in

## 2017-03-27 DIAGNOSIS — E669 Obesity, unspecified: Secondary | ICD-10-CM | POA: Diagnosis not present

## 2017-03-27 DIAGNOSIS — Z8542 Personal history of malignant neoplasm of other parts of uterus: Secondary | ICD-10-CM

## 2017-03-27 DIAGNOSIS — Z08 Encounter for follow-up examination after completed treatment for malignant neoplasm: Secondary | ICD-10-CM | POA: Insufficient documentation

## 2017-03-27 DIAGNOSIS — C541 Malignant neoplasm of endometrium: Secondary | ICD-10-CM

## 2017-03-27 NOTE — Patient Instructions (Signed)
Doing great!  Plan to follow up in one year or sooner if needed. Please call closer to the date to schedule.

## 2017-03-27 NOTE — Progress Notes (Signed)
Follow Up Note: Gyn-Onc  [name] 67 y.o. female  CC:  Chief Complaint  Patient presents with  . Endometrial Cancer    follow up    HPI:  [name] is a 66 year old who was initially seen in April 2012 as a referral from Dr. Kalman Moore for endometrial cancer identified on an endometrial biopsy in March 2012.  She underwent a TAH-BSO, pelvic and paraaortic lymphadenectomy on Jan 04, 2011. Final pathology showed a grade 3 endometrial cancer with superficial myometrial invasion (stage IA). No adjuvant therapy was recommended at that time.  Interval History:  She presents today for continued follow up.  She states that she has been doing well since her last visit.  She is planning on driving to [name] today to go see her older sister who just had a stroke.  She denies any GI or GU symptoms along with no vaginal bleeding or discharge.  Tolerating diet with no nausea or emesis.  Still has not obtained a PCP and does not want lab work. Refused breast examination since she "will have a mammogram."  No other concerns voiced.    Review of Systems  Constitutional: Feels well.  No fever, chills, early satiety, unintentional weight loss or gain, or fatigue.  Cardiovascular: No chest pain, shortness of breath, or edema.  Pulmonary: No cough or wheeze.  Gastrointestinal: No nausea, vomiting, or diarrhea. No bright red blood per rectum or change in bowel movement.  Genitourinary: No frequency, urgency, or dysuria. No vaginal bleeding or discharge.  Musculoskeletal: No myalgia or joint pain. Neurologic: No weakness, numbness, or change in gait.  Psychology: No depression, anxiety, or insomnia.  Health Maintenance: Mammogram:  04/2016. Due in August 2018 Pap Smear:02/05/16: normal Colonoscopy:  Up to date per patient but due to schedule soon Lipid Panel: 2014  Current Meds:  Outpatient Encounter Prescriptions as of 03/27/2017  Medication Sig  . Calcium Carbonate-Vit D-Min (CALCIUM 1200 PO) Take 1  tablet by mouth daily.  . miconazole (MICOTIN) 2 % cream Apply 1 application topically 2 (two) times daily.  . vitamin C (ASCORBIC ACID) 500 MG tablet Take 500 mg by mouth daily.   No facility-administered encounter medications on file as of 03/27/2017.     Allergy:  Allergies  Allergen Reactions  . Codeine Nausea Only  . Cytotec [Misoprostol] Nausea Only    Social Hx:   Social History   Social History  . Marital status: Single    Spouse name: N/A  . Number of children: N/A  . Years of education: N/A   Occupational History  . Not on file.   Social History Main Topics  . Smoking status: Never Smoker  . Smokeless tobacco: Never Used  . Alcohol use No     Comment: occasional  . Drug use: No  . Sexual activity: Not Currently   Other Topics Concern  . Not on file   Social History Narrative  . No narrative on file    Past Surgical Hx:  Past Surgical History:  Procedure Laterality Date  . ABDOMINAL HYSTERECTOMY  5/12   TAHBSO pelvic and paraaortic LND  . TOE SURGERY     Bil feet    Past Medical Hx:  Past Medical History:  Diagnosis Date  . Cancer ([place]) 5/12   gr 3 stg 1A  . Obesity     Family Hx:  Family History  Problem Relation Age of Onset  . Diabetes Father   . Lung cancer Sister   .  Uterine cancer Sister     Vitals:  Blood pressure 131/60, pulse 72, temperature 98.2 F (36.8 C), temperature source Oral, resp. rate 18, height 5\' 7"  (1.702 m), SpO2 98 %.  Physical Exam:  General: Well developed, well nourished female in no acute distress. Alert and oriented x 3.  Head/Neck: Sclerae anicteric.  Oropharynx clear with no lesions.  Supple without any enlargements.  Lymph node survey: No cervical, supraclavicular, or inguinal adenopathy.  Cardiovascular: Regular rate and rhythm. S1 and S2 normal.  Lungs: Clear to auscultation bilaterally. No wheezes/crackles/rhonchi noted.  Skin: No rashes or lesions present. Back: No CVA tenderness.  Abdomen:  Abdomen soft, non-tender and obese. Active bowel sounds in all quadrants. No evidence of a fluid wave or abdominal masses.  Genitourinary:    Vulva/vagina: Normal external female genitalia. No lesions.    Urethra: No lesions or masses.    Vagina: Atrophic without any lesions. No palpable masses. No vaginal bleeding or drainage noted.   Rectal: Good tone, no masses, no cul de sac nodularity.  Extremities: No bilateral cyanosis, edema, or clubbing.    Assessment/Plan:   [name] is a 66 year old who underwent a TAH-BSO, pelvic and paraaortic lymphadenectomy on Jan 04, 2011 for a grade 3 endometrial cancer with superficial myometrial invasion (stage IA).  No adjuvant therapy was recommended.  She is doing well and is without evidence of disease at this time.    She is advised of the importance of having a primary care provider but refusing at this time.  She is advised that she can follow up in one year or sooner if needed.  Reportable signs and symptoms reviewed.  She is to call the office for any concerns/questions.      Jasmine Scarpelli DEAL, NP 03/27/2017, 10:11 AM

## 2017-04-13 ENCOUNTER — Other Ambulatory Visit: Payer: Self-pay | Admitting: Gynecology

## 2017-04-13 ENCOUNTER — Other Ambulatory Visit: Payer: Self-pay | Admitting: Internal Medicine

## 2017-04-13 DIAGNOSIS — Z1231 Encounter for screening mammogram for malignant neoplasm of breast: Secondary | ICD-10-CM

## 2017-05-10 ENCOUNTER — Ambulatory Visit
Admission: RE | Admit: 2017-05-10 | Discharge: 2017-05-10 | Disposition: A | Discharge status: Home / Self Care | Payer: Medicare HMO | Source: Ambulatory Visit | Attending: Internal Medicine | Admitting: Internal Medicine

## 2017-05-10 DIAGNOSIS — Z1231 Encounter for screening mammogram for malignant neoplasm of breast: Secondary | ICD-10-CM

## 2017-06-21 ENCOUNTER — Encounter (HOSPITAL_COMMUNITY): Payer: Self-pay

## 2017-06-21 ENCOUNTER — Emergency Department (HOSPITAL_COMMUNITY)
Admission: EM | Admit: 2017-06-21 | Discharge: 2017-06-21 | Disposition: A | Discharge status: Home / Self Care | Payer: Medicare HMO | Attending: Emergency Medicine | Admitting: Emergency Medicine

## 2017-06-21 DIAGNOSIS — Y929 Unspecified place or not applicable: Secondary | ICD-10-CM | POA: Diagnosis not present

## 2017-06-21 DIAGNOSIS — S61412A Laceration without foreign body of left hand, initial encounter: Secondary | ICD-10-CM | POA: Diagnosis not present

## 2017-06-21 DIAGNOSIS — W260XXA Contact with knife, initial encounter: Secondary | ICD-10-CM | POA: Diagnosis not present

## 2017-06-21 DIAGNOSIS — Z8542 Personal history of malignant neoplasm of other parts of uterus: Secondary | ICD-10-CM | POA: Diagnosis not present

## 2017-06-21 DIAGNOSIS — Y999 Unspecified external cause status: Secondary | ICD-10-CM | POA: Diagnosis not present

## 2017-06-21 DIAGNOSIS — Z79899 Other long term (current) drug therapy: Secondary | ICD-10-CM | POA: Diagnosis not present

## 2017-06-21 DIAGNOSIS — Y939 Activity, unspecified: Secondary | ICD-10-CM | POA: Insufficient documentation

## 2017-06-21 DIAGNOSIS — S6992XA Unspecified injury of left wrist, hand and finger(s), initial encounter: Secondary | ICD-10-CM | POA: Diagnosis present

## 2017-06-21 DIAGNOSIS — Z23 Encounter for immunization: Secondary | ICD-10-CM | POA: Insufficient documentation

## 2017-06-21 MED ORDER — LIDOCAINE HCL (PF) 1 % IJ SOLN
5.0000 mL | Freq: Once | INTRAMUSCULAR | Status: AC
  Administered 2017-06-21: 5 mL
  Filled 2017-06-21: qty 5

## 2017-06-21 MED ORDER — TETANUS-DIPHTH-ACELL PERTUSSIS 5-2.5-18.5 LF-MCG/0.5 IM SUSP
0.5000 mL | Freq: Once | INTRAMUSCULAR | Status: AC
  Administered 2017-06-21: 0.5 mL via INTRAMUSCULAR
  Filled 2017-06-21: qty 0.5

## 2017-06-21 NOTE — ED Provider Notes (Signed)
[facility] DEPT Provider Note   CSN: [id number] Arrival date & time: 06/21/17  0750     History   Chief Complaint Chief Complaint  Patient presents with  . Laceration    left hand    HPI [name] is a 66 y.o. female w PMHx endometrial adenocarcinoma (not currently receiving tx), presenting to the ED with laceration to left hand that occurred yesterday evening. Pt states she cut her hand on a clean knife. She reports cleaning it with hydrogen peroxide then applying aloe. She denies pain assoc with area, denies dec ROM, F/C, purulent drainage or any other complaints. Denies hx DM. Is not up to date on tetanus.  The history is provided by the patient.    Past Medical History:  Diagnosis Date  . 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 Obesity     Patient Active Problem List   Diagnosis Date Noted  . Endometrial adenocarcinoma ([place]) 09/09/2011  . Endometrial cancer ([place]) 09/07/2011    Past Surgical History:  Procedure Laterality Date  . ABDOMINAL HYSTERECTOMY  5/12   TAHBSO pelvic and paraaortic LND  . TOE SURGERY     Bil feet    OB History    No data available       Home Medications    Prior to Admission medications   Medication Sig Start Date End Date Taking? Authorizing Provider  Calcium Carbonate-Vit D-Min (CALCIUM 1200 PO) Take 1 tablet by mouth daily.    [provider]  miconazole (MICOTIN) 2 % cream Apply 1 application topically 2 (two) times daily. 11/17/14   Joylene John D, NP  vitamin C (ASCORBIC ACID) 500 MG tablet Take 500 mg by mouth daily.    [provider]    Family History Family History  Problem Relation Age of Onset  . Diabetes Father   . Lung cancer Sister   . Uterine cancer Sister     Social History Social History  Substance Use Topics  . Smoking status: Never Smoker  . Smokeless tobacco: Never Used  . Alcohol use Yes     Comment: occasional     Allergies   Codeine and  Cytotec [misoprostol]   Review of Systems Review of Systems  Constitutional: Negative for chills and fever.  Skin: Positive for wound.  Neurological: Negative for numbness.     Physical Exam Updated Vital Signs BP (!) 157/69 (BP Location: Left Arm)   Pulse 80   Temp 98.1 F (36.7 C) (Oral)   Resp 18   Ht 5\' 7"  (1.702 m)   SpO2 98%   Physical Exam  Constitutional: She appears well-developed and well-nourished. No distress.  HENT:  Head: Normocephalic and atraumatic.  Eyes: Conjunctivae are normal.  Cardiovascular: Normal rate and intact distal pulses.   Pulmonary/Chest: Effort normal.  Musculoskeletal:  1inch laceration to left palmar aspect of hand at base of 3rd finger. Not grossly contaminated, not tender. Normal full active flexion and extension of digits. NV intact.  Psychiatric: She has a normal mood and affect. Her behavior is normal.  Nursing note and vitals reviewed.        ED Treatments / Results  Labs (all labs ordered are listed, but only abnormal results are displayed) Labs Reviewed - No data to display  EKG  EKG Interpretation None       Radiology No results found.  Procedures .Marland KitchenLaceration Repair Date/Time: 06/21/2017 10:34 AM Performed by: RUSSO, Martin[place] N Authorized by: Virgina Jock  [place] N   Consent:    Consent obtained:  Verbal   Consent given by:  Patient   Risks discussed:  Infection, pain and poor cosmetic result   Alternatives discussed:  No treatment and observation Anesthesia (see MAR for exact dosages):    Anesthesia method:  Local infiltration   Local anesthetic:  Lidocaine 1% w/o epi Laceration details:    Location:  Hand   Hand location:  L palm   Length (cm):  2 Repair type:    Repair type:  Simple Pre-procedure details:    Preparation:  Patient was prepped and draped in usual sterile fashion Exploration:    Hemostasis achieved with:  Direct pressure   Wound exploration: wound explored through full range of motion and  entire depth of wound probed and visualized     Wound extent: no foreign bodies/material noted, no nerve damage noted, no tendon damage noted and no vascular damage noted     Contaminated: no   Treatment:    Area cleansed with:  Saline   Amount of cleaning:  Standard   Irrigation solution:  Sterile saline   Irrigation method:  Syringe   Visualized foreign bodies/material removed: no   Skin repair:    Repair method:  Sutures   Suture size:  4-0   Suture material:  Prolene   Suture technique:  Simple interrupted   Number of sutures:  3 Approximation:    Approximation:  Close   Vermilion border: well-aligned   Post-procedure details:    Dressing:  Non-adherent dressing   Patient tolerance of procedure:  Tolerated well, no immediate complications   (including critical care time)  Medications Ordered in ED Medications  Tdap (BOOSTRIX) injection 0.5 mL (0.5 mLs Intramuscular Refused 06/21/17 0934)  lidocaine (PF) (XYLOCAINE) 1 % injection 5 mL (5 mLs Infiltration Given 06/21/17 0949)     Initial Impression / Assessment and Plan / ED Course  I have reviewed the triage vital signs and the nursing notes.  Pertinent labs & imaging results that were available during my care of the patient were reviewed by me and considered in my medical decision making (see chart for details).     Pressure irrigation performed. Wound explored and base of wound visualized in a bloodless field without evidence of foreign body. Tdap updated.  Pt has no comorbidities to effect normal wound healing. Pt discharged without antibiotics.  Discussed suture home care with patient and answered questions. Pt to follow-up for wound check and suture removal in 7 days; they are to return to the ED sooner for signs of infection. Pt is hemodynamically stable with no complaints prior to dc.   Patient discussed with and seen by Dr. Lacinda Axon.  Discussed results, findings, treatment and follow up. Patient advised of return  precautions. Patient verbalized understanding and agreed with plan.  Final Clinical Impressions(s) / ED Diagnoses   Final diagnoses:  Laceration of left hand without foreign body, initial encounter    New Prescriptions New Prescriptions   No medications on file     Russo, [place] N, PA-C 06/21/17 1038    Nat Christen, MD 06/24/17 1133

## 2017-06-21 NOTE — Discharge Instructions (Signed)
Please read instructions below.  Keep your wound clean and covered. Starting tomorrow, gently wash your wound with soap and water, then pat it dry and recover.  Follow up with your primary care or urgent care for wound recheck in 7 days.  You can take tylenol or aleve as needed for pain. Return to the ER for fever, pus draining from wound, redness, or new or worsening symptoms.

## 2017-06-21 NOTE — ED Triage Notes (Signed)
Patient states she cut her left palm with a steak knife yesterday at 1500. approx 1 inch laceration. No bleeding. Patient states she used aloe vera to the laceration.

## 2017-06-21 NOTE — ED Notes (Signed)
BLEEDING CONTROLED PATIENT HAS GOOD PMS IN HAND

## 2017-06-28 ENCOUNTER — Emergency Department (HOSPITAL_COMMUNITY)
Admission: EM | Admit: 2017-06-28 | Discharge: 2017-06-28 | Disposition: A | Discharge status: Home / Self Care | Payer: Medicare HMO | Attending: Emergency Medicine | Admitting: Emergency Medicine

## 2017-06-28 ENCOUNTER — Encounter (HOSPITAL_COMMUNITY): Payer: Self-pay | Admitting: Emergency Medicine

## 2017-06-28 DIAGNOSIS — Z4801 Encounter for change or removal of surgical wound dressing: Secondary | ICD-10-CM | POA: Diagnosis not present

## 2017-06-28 DIAGNOSIS — Z4802 Encounter for removal of sutures: Secondary | ICD-10-CM | POA: Diagnosis not present

## 2017-06-28 NOTE — ED Provider Notes (Signed)
[facility] DEPT Provider Note   CSN: [id number] Arrival date & time: 06/28/17  0849     History   Chief Complaint Chief Complaint  Patient presents with  . Suture / Staple Removal    HPI [name] is a 66 y.o. female.      The history is provided by the patient. No language interpreter was used.  Suture / Staple Removal    66 year old female who presents the emergency department for suture removal.  Injury occurred 7 days ago.  Occurred to the left hand.  She has been cleaning the wound and keeping it wrapped.  She denies any signs of infection. Past Medical History:  Diagnosis Date  . Cancer ([place]) 5/12   gr 3 stg 1A  . Obesity     Patient Active Problem List   Diagnosis Date Noted  . Endometrial adenocarcinoma ([place]) 09/09/2011  . Endometrial cancer ([place]) 09/07/2011    Past Surgical History:  Procedure Laterality Date  . ABDOMINAL HYSTERECTOMY  5/12   TAHBSO pelvic and paraaortic LND  . TOE SURGERY     Bil feet    OB History    No data available       Home Medications    Prior to Admission medications   Medication Sig Start Date End Date Taking? Authorizing Provider  Calcium Carbonate-Vit D-Min (CALCIUM 1200 PO) Take 1 tablet by mouth daily.    [provider]  miconazole (MICOTIN) 2 % cream Apply 1 application topically 2 (two) times daily. 11/17/14   Joylene John D, NP  vitamin C (ASCORBIC ACID) 500 MG tablet Take 500 mg by mouth daily.    [provider]    Family History Family History  Problem Relation Age of Onset  . Diabetes Father   . Lung cancer Sister   . Uterine cancer Sister     Social History Social History  Substance Use Topics  . Smoking status: Never Smoker  . Smokeless tobacco: Never Used  . Alcohol use Yes     Comment: occasional     Allergies   Codeine and Cytotec [misoprostol]   Review of Systems Review of Systems  Constitutional: Negative for chills and  fever.  Skin: Positive for wound.     Physical Exam Updated Vital Signs BP (!) 156/90 (BP Location: Right Arm)   Pulse 98   Temp 98.3 F (36.8 C) (Oral)   Resp 18   SpO2 98%   Physical Exam  Constitutional: She is oriented to person, place, and time. She appears well-developed and well-nourished. No distress.  HENT:  Head: Normocephalic and atraumatic.  Eyes: Conjunctivae are normal. No scleral icterus.  Neck: Normal range of motion.  Cardiovascular: Normal rate, regular rhythm and normal heart sounds.  Exam reveals no gallop and no friction rub.   No murmur heard. Pulmonary/Chest: Effort normal and breath sounds normal. No respiratory distress.  Abdominal: Soft. Bowel sounds are normal. She exhibits no distension and no mass. There is no tenderness. There is no guarding.  Musculoskeletal:  3 well placed sutures no evidence of infection  Neurological: She is alert and oriented to person, place, and time.  Skin: Skin is warm and dry. She is not diaphoretic.  Psychiatric:  Anxious  Nursing note and vitals reviewed.    ED Treatments / Results  Labs (all labs ordered are listed, but only abnormal results are displayed) Labs Reviewed - No data to display  EKG  EKG Interpretation None  Radiology No results found.  Procedures Procedures (including critical care time)  Medications Ordered in ED Medications - No data to display   Initial Impression / Assessment and Plan / ED Course  I have reviewed the triage vital signs and the nursing notes.  Pertinent labs & imaging results that were available during my care of the patient were reviewed by me and considered in my medical decision making (see chart for details).     SUTURE REMOVAL Performed by: Margarita Mail  Consent: Verbal consent obtained. Patient identity confirmed: provided demographic data Time out: Immediately prior to procedure a "time out" was called to verify the correct patient,  procedure, equipment, support staff and site/side marked as required.  Location details: left palm   Wound Appearance: clean  Sutures/Staples Removed: 3  Facility: sutures placed in this facility Patient tolerance: Patient tolerated the procedure well with no immediate complications.     Final Clinical Impressions(s) / ED Diagnoses   Final diagnoses:  Visit for suture removal   Staple removal   Pt to ER for staple/suture removal and wound check as above. Procedure tolerated well. Vitals normal, no signs of infection. Scar minimization & return precautions given at dc.   New Prescriptions New Prescriptions   No medications on file     Ned Grace 06/28/17 1010    Jola Schmidt, MD 06/28/17 1700

## 2017-06-28 NOTE — ED Triage Notes (Signed)
Pt presents for suture removal to left hand. Denies complaints at present time.

## 2017-06-28 NOTE — ED Notes (Signed)
No response when call to triage from lobby.

## 2017-06-28 NOTE — ED Notes (Signed)
Bed: WTR8 Expected date:  Expected time:  Means of arrival:  Comments: 

## 2017-06-28 NOTE — Discharge Instructions (Signed)
Contact a health care provider if: You have increasing redness, swelling, or pain in the wound. You see pus coming from the wound. You have a fever. You notice a bad smell coming from the wound or dressing. Your wound breaks open (edges not staying together).

## 2017-10-02 DIAGNOSIS — H40013 Open angle with borderline findings, low risk, bilateral: Secondary | ICD-10-CM | POA: Diagnosis not present

## 2017-10-02 DIAGNOSIS — H2513 Age-related nuclear cataract, bilateral: Secondary | ICD-10-CM | POA: Diagnosis not present

## 2017-10-02 DIAGNOSIS — H43811 Vitreous degeneration, right eye: Secondary | ICD-10-CM | POA: Diagnosis not present

## 2017-10-02 DIAGNOSIS — H35033 Hypertensive retinopathy, bilateral: Secondary | ICD-10-CM | POA: Diagnosis not present

## 2017-11-15 DIAGNOSIS — Z0100 Encounter for examination of eyes and vision without abnormal findings: Secondary | ICD-10-CM | POA: Diagnosis not present

## 2018-02-21 ENCOUNTER — Telehealth: Payer: Self-pay | Admitting: *Deleted

## 2018-02-21 NOTE — Telephone Encounter (Signed)
Called the patient and left a message to call the office back. Patient needs to be scheduled to see [facility] for a follow up appt

## 2018-02-22 NOTE — Telephone Encounter (Signed)
Patient called back and stated that she will call in July for a December appt

## 2018-03-06 ENCOUNTER — Telehealth: Payer: Self-pay | Admitting: *Deleted

## 2018-03-06 NOTE — Telephone Encounter (Signed)
Patient called and scheduled a follow up appt for December with [facility]

## 2018-04-03 ENCOUNTER — Other Ambulatory Visit: Payer: Self-pay | Admitting: Internal Medicine

## 2018-04-03 DIAGNOSIS — Z1231 Encounter for screening mammogram for malignant neoplasm of breast: Secondary | ICD-10-CM

## 2018-05-15 ENCOUNTER — Ambulatory Visit
Admission: RE | Admit: 2018-05-15 | Discharge: 2018-05-15 | Disposition: A | Discharge status: Home / Self Care | Payer: Medicare HMO | Source: Ambulatory Visit | Attending: Gynecology | Admitting: Gynecology

## 2018-05-15 DIAGNOSIS — Z1231 Encounter for screening mammogram for malignant neoplasm of breast: Secondary | ICD-10-CM | POA: Diagnosis not present

## 2018-08-16 ENCOUNTER — Encounter: Payer: Self-pay | Admitting: Gynecologic Oncology

## 2018-08-16 ENCOUNTER — Inpatient Hospital Stay: Payer: Medicare HMO | Attending: Gynecologic Oncology | Admitting: Gynecologic Oncology

## 2018-08-16 VITALS — BP 158/82 | HR 84 | Temp 98.2°F | Resp 20

## 2018-08-16 DIAGNOSIS — C541 Malignant neoplasm of endometrium: Secondary | ICD-10-CM | POA: Diagnosis not present

## 2018-08-16 DIAGNOSIS — Z90710 Acquired absence of both cervix and uterus: Secondary | ICD-10-CM | POA: Diagnosis not present

## 2018-08-16 DIAGNOSIS — Z90722 Acquired absence of ovaries, bilateral: Secondary | ICD-10-CM | POA: Diagnosis not present

## 2018-08-16 NOTE — Patient Instructions (Signed)
Doing great! No evidence of recurrence of your cancer on today's exam.  Plan to follow up in one year or sooner if needed.  Please call the office in September or October 2020 at [phone] to schedule your appointment.  Please call the office for any symptoms such as abdominal pain, bloating, change in appetite or weight, vaginal bleeding.

## 2018-08-16 NOTE — Progress Notes (Signed)
Follow Up Note: Gyn-Onc  [name] 67 y.o. female  CC:  Chief Complaint  Patient presents with  . Endometrial cancer [facility])    follow up    HPI:  [name] is a 67 year old who was initially seen in April 2012 as a referral from Dr. Kalman Shan for endometrial cancer identified on an endometrial biopsy in March 2012.  She underwent a TAH-BSO, pelvic and paraaortic lymphadenectomy on Jan 04, 2011. Final pathology showed a grade 3 endometrial cancer with superficial myometrial invasion (stage IA). No adjuvant therapy was recommended at that time.  Interval History:  She presents today for continued follow up.  She states that she has been doing well since her last visit.  She traveled to see her sister in [place] and her other sister in [place] for Thanksgiving.  She states her sister in [place] who had a stroke last year is doing very well. She denies any GI or GU symptoms along with no vaginal bleeding or discharge.  Tolerating diet with no nausea or emesis.  Still has not obtained a PCP and after recommending lab work she states "If it ain't broke don't fix it."  No other concerns voiced.    Review of Systems  Constitutional: Feels well.  No fever, chills, early satiety, unintentional weight loss or gain, or fatigue.  Cardiovascular: No chest pain, shortness of breath, or edema.  Pulmonary: No cough or wheeze.  Gastrointestinal: No nausea, vomiting, or diarrhea. No bright red blood per rectum or change in bowel movement.  Genitourinary: No frequency, urgency, or dysuria. No vaginal bleeding or discharge.  Musculoskeletal: No myalgia or joint pain. Neurologic: No weakness, numbness, or change in gait.  Psychology: No depression, anxiety, or insomnia.  Health Maintenance: Mammogram:  05/2018.  Pap Smear:02/05/16: normal Colonoscopy:  Up to date per patient but due to schedule soon Lipid Panel: 2014  Current Meds:  Outpatient Encounter Medications as of 08/16/2018  Medication Sig  .  Calcium Carbonate-Vit D-Min (CALCIUM 1200 PO) Take 1 tablet by mouth daily.  . vitamin C (ASCORBIC ACID) 500 MG tablet Take 500 mg by mouth daily.  . [DISCONTINUED] miconazole (MICOTIN) 2 % cream Apply 1 application topically 2 (two) times daily.   No facility-administered encounter medications on file as of 08/16/2018.     Allergy:  Allergies  Allergen Reactions  . Codeine Nausea Only  . Cytotec [Misoprostol] Nausea Only    Social Hx:   Social History   Socioeconomic History  . Marital status: Single    Spouse name: Not on file  . Number of children: Not on file  . Years of education: Not on file  . Highest education level: Not on file  Occupational History  . Not on file  Social Needs  . Financial resource strain: Not on file  . Food insecurity:    Worry: Not on file    Inability: Not on file  . Transportation needs:    Medical: Not on file    Non-medical: Not on file  Tobacco Use  . Smoking status: Never Smoker  . Smokeless tobacco: Never Used  Substance and Sexual Activity  . Alcohol use: Yes    Comment: occasional  . Drug use: No  . Sexual activity: Not Currently  Lifestyle  . Physical activity:    Days per week: Not on file    Minutes per session: Not on file  . Stress: Not on file  Relationships  . Social connections:    Talks on  phone: Not on file    Gets together: Not on file    Attends religious service: Not on file    Active member of club or organization: Not on file    Attends meetings of clubs or organizations: Not on file    Relationship status: Not on file  . Intimate partner violence:    Fear of current or ex partner: Not on file    Emotionally abused: Not on file    Physically abused: Not on file    Forced sexual activity: Not on file  Other Topics Concern  . Not on file  Social History Narrative  . Not on file    Past Surgical Hx:  Past Surgical History:  Procedure Laterality Date  . ABDOMINAL HYSTERECTOMY  5/12   TAHBSO pelvic  and paraaortic LND  . TOE SURGERY     Bil feet    Past Medical Hx:  Past Medical History:  Diagnosis Date  . Cancer ([place]) 5/12   gr 3 stg 1A  . Obesity     Family Hx:  Family History  Problem Relation Age of Onset  . Diabetes Father   . Lung cancer Sister   . Uterine cancer Sister     Vitals:  Blood pressure (!) 158/82, pulse 84, temperature 98.2 F (36.8 C), temperature source Oral, resp. rate 20, SpO2 100 %.  Physical Exam:  General: Well developed, well nourished female in no acute distress. Alert and oriented x 3.  Head/Neck: Sclerae anicteric.  Oropharynx clear with no lesions.  Supple without any enlargements.  Lymph node survey: No cervical, supraclavicular, or inguinal adenopathy.  Cardiovascular: Regular rate and rhythm. S1 and S2 normal.  Lungs: Clear to auscultation bilaterally. No wheezes/crackles/rhonchi noted.  Skin: No rashes or lesions present. Back: No CVA tenderness.  Abdomen: Abdomen soft, non-tender and obese. Active bowel sounds in all quadrants. No evidence of a fluid wave or abdominal masses.  Genitourinary:    Vulva/vagina: Normal external female genitalia. No lesions.    Urethra: No lesions or masses.    Vagina: Atrophic without any lesions. No palpable masses. No vaginal bleeding or drainage noted.   Rectal: Good tone, no masses, no cul de sac nodularity. Minimal amount of stool noted in the rectum. Extremities: No bilateral cyanosis, edema, or clubbing.    Assessment/Plan:   [name] is a 67 year old who underwent a TAH-BSO, pelvic and paraaortic lymphadenectomy on Jan 04, 2011 for a grade 3 endometrial cancer with superficial myometrial invasion (stage IA).  No adjuvant therapy was recommended.  She is doing well and is without evidence of disease at this time.    She is advised of the importance of having a primary care provider and for lab work but stating "if not broke don't fix it."  She is advised that she can follow up in one year or  sooner if needed.  Reportable signs and symptoms reviewed.  She is to call the office for any concerns/questions.      Dorothyann Gibbs, NP 08/16/2018, 10:29 AM

## 2018-11-13 DIAGNOSIS — H2513 Age-related nuclear cataract, bilateral: Secondary | ICD-10-CM | POA: Diagnosis not present

## 2018-11-13 DIAGNOSIS — H35033 Hypertensive retinopathy, bilateral: Secondary | ICD-10-CM | POA: Diagnosis not present

## 2018-11-13 DIAGNOSIS — H40013 Open angle with borderline findings, low risk, bilateral: Secondary | ICD-10-CM | POA: Diagnosis not present

## 2018-11-13 DIAGNOSIS — H43811 Vitreous degeneration, right eye: Secondary | ICD-10-CM | POA: Diagnosis not present

## 2019-02-18 ENCOUNTER — Telehealth: Payer: Self-pay | Admitting: Gynecologic Oncology

## 2019-02-18 NOTE — Telephone Encounter (Signed)
Returned call to patient.  Left message asking her to please call the office at her earliest convenience.

## 2019-03-04 ENCOUNTER — Ambulatory Visit: Payer: Medicare HMO | Admitting: Gynecologic Oncology

## 2019-04-10 ENCOUNTER — Other Ambulatory Visit: Payer: Self-pay | Admitting: Gynecology

## 2019-04-10 DIAGNOSIS — Z1231 Encounter for screening mammogram for malignant neoplasm of breast: Secondary | ICD-10-CM

## 2019-05-21 ENCOUNTER — Ambulatory Visit: Payer: Medicare HMO

## 2019-05-29 ENCOUNTER — Other Ambulatory Visit: Payer: Self-pay

## 2019-05-29 ENCOUNTER — Ambulatory Visit
Admission: RE | Admit: 2019-05-29 | Discharge: 2019-05-29 | Disposition: A | Discharge status: Home / Self Care | Payer: Medicare HMO | Source: Ambulatory Visit | Attending: Gynecology | Admitting: Gynecology

## 2019-05-29 DIAGNOSIS — Z1231 Encounter for screening mammogram for malignant neoplasm of breast: Secondary | ICD-10-CM | POA: Diagnosis not present

## 2019-08-08 NOTE — Progress Notes (Signed)
Follow Up Note: Gyn-Onc  [name] 68 y.o. female  CC:  Chief Complaint  Patient presents with  . Endometrial cancer ([place])    Follow up    HPI:  [name] is a 68 year old who was initially seen in April 2012 as a referral from Dr. Kalman Shan for endometrial cancer identified on an endometrial biopsy in March 2012.  She underwent a TAH-BSO, pelvic and paraaortic lymphadenectomy on Jan 04, 2011. Final pathology showed a grade 3 endometrial cancer with superficial myometrial invasion (stage IA). No adjuvant therapy was recommended at that time. She has been without evidence of recurrence since that time.  Interval History:  She presents today for continued follow up.  No other concerns voiced since her last visit.  She states she has been healthy since the last time we saw her and she is listening to the recommendations in regards to [place].  Tolerating diet with no nausea or emesis reported. She is asking about recommendations for primary care providers if she decides to be seen in the future. She does not want to initiate this referral now and states she thinks she is fine in that department.  Spoke with her about possible lab work today but she is refusing at this time because she does not want any unexpected costs. No other concerns voiced.      Review of Systems  Constitutional: Feels well.  No fever, chills, early satiety, unintentional weight loss or gain, or fatigue.  Cardiovascular: No chest pain, shortness of breath, or edema.  Pulmonary: No cough or wheeze.  Gastrointestinal: No nausea, vomiting, or diarrhea. No bright red blood per rectum or change in bowel movement.  Genitourinary: No frequency, urgency, or dysuria. No vaginal bleeding or discharge.  Musculoskeletal: No myalgia or joint pain. Neurologic: No weakness, numbness, or change in gait.  Psychology: No depression, anxiety, or insomnia.  Health Maintenance: Mammogram:  05/29/2019.  Pap Smear:02/05/16:  normal Colonoscopy:  Up to date per patient but due to schedule soon Lipid Panel: 2014  Current Meds:  Outpatient Encounter Medications as of 08/14/2019  Medication Sig  . Calcium Carbonate-Vit D-Min (CALCIUM 1200 PO) Take 1 tablet by mouth daily.  Marland Kitchen DANDELION PO Take by mouth.  . vitamin C (ASCORBIC ACID) 500 MG tablet Take 500 mg by mouth daily.   No facility-administered encounter medications on file as of 08/14/2019.     Allergy:  Allergies  Allergen Reactions  . Codeine Nausea Only  . Cytotec [Misoprostol] Nausea Only    Social Hx:   Social History   Socioeconomic History  . Marital status: Single    Spouse name: Not on file  . Number of children: Not on file  . Years of education: Not on file  . Highest education level: Not on file  Occupational History  . Not on file  Social Needs  . Financial resource strain: Not on file  . Food insecurity    Worry: Not on file    Inability: Not on file  . Transportation needs    Medical: Not on file    Non-medical: Not on file  Tobacco Use  . Smoking status: Never Smoker  . Smokeless tobacco: Never Used  Substance and Sexual Activity  . Alcohol use: Yes    Comment: occasional  . Drug use: No  . Sexual activity: Not Currently  Lifestyle  . Physical activity    Days per week: Not on file    Minutes per session: Not on file  .  Stress: Not on file  Relationships  . Social Herbalist on phone: Not on file    Gets together: Not on file    Attends religious service: Not on file    Active member of club or organization: Not on file    Attends meetings of clubs or organizations: Not on file    Relationship status: Not on file  . Intimate partner violence    Fear of current or ex partner: Not on file    Emotionally abused: Not on file    Physically abused: Not on file    Forced sexual activity: Not on file  Other Topics Concern  . Not on file  Social History Narrative  . Not on file    Past Surgical Hx:   Past Surgical History:  Procedure Laterality Date  . ABDOMINAL HYSTERECTOMY  5/12   TAHBSO pelvic and paraaortic LND  . TOE SURGERY     Bil feet    Past Medical Hx:  Past Medical History:  Diagnosis Date  . Cancer ([place]) 5/12   gr 3 stg 1A  . Obesity     Family Hx:  Family History  Problem Relation Age of Onset  . Diabetes Father   . Lung cancer Sister   . Uterine cancer Sister     Vitals:  Blood pressure (!) 145/67, pulse 85, temperature 98.2 F (36.8 C), temperature source Temporal, resp. rate 18, SpO2 100 %.  Physical Exam:  General: Well developed, well nourished female in no acute distress. Alert and oriented x 3.  Head/Neck: Sclerae anicteric.  Oropharynx clear with no lesions.  Supple without any enlargements.  Lymph node survey: No cervical, supraclavicular adenopathy.  Cardiovascular: Regular rate and rhythm. S1 and S2 normal.  Lungs: Clear to auscultation bilaterally. No wheezes/crackles/rhonchi noted.  Skin: No rashes or lesions present. Back: No CVA tenderness.  Abdomen: Abdomen soft, non-tender and obese. Active bowel sounds in all quadrants. No evidence of a fluid wave or abdominal masses. Midline incision with no evidence of herniation or masses.  Genitourinary:    Vulva/vagina: Normal external female genitalia. No lesions.    Urethra: No lesions or masses.    Vagina: Atrophic without any lesions. No palpable masses. No vaginal bleeding or drainage noted.   Rectal: Good tone, no masses, no cul de sac nodularity.  Extremities: No bilateral cyanosis, edema, or clubbing.  Bandage noted on left lower extremity at calf level.  States she has muscle rub underneath to assist with a sore muscle.  Assessment/Plan:   [name] is a 68 year old who underwent a TAH-BSO, pelvic and paraaortic lymphadenectomy on Jan 04, 2011 for a grade 3 endometrial cancer with superficial myometrial invasion (stage IA).  No adjuvant therapy was recommended.  She is advised that she  can follow up in one year or sooner if needed.  Reportable signs and symptoms reviewed.  Survivorship care plan given to the patient and discussed along with a copy of her pathology report for her records.  Advised her to call when she felt comfortable with moving forward with a referral to primary care. She is to call the office for any concerns/questions.      Dorothyann Gibbs, NP 08/14/2019, 10:43 AM

## 2019-08-14 ENCOUNTER — Inpatient Hospital Stay: Payer: Medicare HMO | Attending: Gynecologic Oncology | Admitting: Gynecologic Oncology

## 2019-08-14 ENCOUNTER — Encounter: Payer: Self-pay | Admitting: Gynecologic Oncology

## 2019-08-14 ENCOUNTER — Other Ambulatory Visit: Payer: Self-pay

## 2019-08-14 VITALS — BP 145/67 | HR 85 | Temp 98.2°F | Resp 18

## 2019-08-14 DIAGNOSIS — C541 Malignant neoplasm of endometrium: Secondary | ICD-10-CM | POA: Insufficient documentation

## 2019-08-14 DIAGNOSIS — Z90710 Acquired absence of both cervix and uterus: Secondary | ICD-10-CM | POA: Diagnosis not present

## 2019-08-14 DIAGNOSIS — E669 Obesity, unspecified: Secondary | ICD-10-CM | POA: Diagnosis not present

## 2019-08-14 DIAGNOSIS — Z90722 Acquired absence of ovaries, bilateral: Secondary | ICD-10-CM | POA: Diagnosis not present

## 2019-08-14 NOTE — Patient Instructions (Signed)
You are doing well.  No sign of your endometrial cancer returning.  Plan to follow up in one year or sooner if needed. Please call in Aug or Sept 2021 to schedule for December 2021.  You can let me know when you are ready for a referral to primary care.  My recommendations include Dr. Ethlyn Gallery at [facility] or Dr. Billey Gosling at [facility] [place].

## 2020-04-24 ENCOUNTER — Other Ambulatory Visit: Payer: Self-pay | Admitting: Gynecology

## 2020-04-24 DIAGNOSIS — Z1231 Encounter for screening mammogram for malignant neoplasm of breast: Secondary | ICD-10-CM

## 2020-06-01 ENCOUNTER — Other Ambulatory Visit: Payer: Self-pay

## 2020-06-01 ENCOUNTER — Ambulatory Visit
Admission: RE | Admit: 2020-06-01 | Discharge: 2020-06-01 | Disposition: A | Discharge status: Home / Self Care | Payer: Medicare HMO | Source: Ambulatory Visit | Attending: Gynecology | Admitting: Gynecology

## 2020-06-01 DIAGNOSIS — Z1231 Encounter for screening mammogram for malignant neoplasm of breast: Secondary | ICD-10-CM

## 2020-07-04 ENCOUNTER — Ambulatory Visit: Payer: Medicare HMO | Attending: Internal Medicine

## 2020-07-04 DIAGNOSIS — Z23 Encounter for immunization: Secondary | ICD-10-CM

## 2020-07-04 NOTE — Progress Notes (Signed)
° °  Covid-19 Vaccination Clinic  Name:  [name]    MRN: [id number] DOB: March 18, 1951  07/04/2020  Ms. [name] was observed post Covid-19 immunization for 15 minutes without incident. She was provided with Vaccine Information Sheet and instruction to access the V-Safe system.   Ms. [name] was instructed to call 911 with any severe reactions post vaccine:  Difficulty breathing   Swelling of face and throat   A fast heartbeat   A bad rash all over body   Dizziness and weakness

## 2020-08-11 ENCOUNTER — Other Ambulatory Visit: Payer: Self-pay

## 2020-08-11 ENCOUNTER — Encounter: Payer: Self-pay | Admitting: Gynecologic Oncology

## 2020-08-11 ENCOUNTER — Inpatient Hospital Stay: Payer: Medicare HMO | Attending: Gynecologic Oncology | Admitting: Gynecologic Oncology

## 2020-08-11 VITALS — BP 138/64 | HR 79 | Temp 97.5°F | Resp 18

## 2020-08-11 DIAGNOSIS — C541 Malignant neoplasm of endometrium: Secondary | ICD-10-CM | POA: Diagnosis not present

## 2020-08-11 DIAGNOSIS — Z90710 Acquired absence of both cervix and uterus: Secondary | ICD-10-CM

## 2020-08-11 DIAGNOSIS — Z90722 Acquired absence of ovaries, bilateral: Secondary | ICD-10-CM

## 2020-08-11 NOTE — Progress Notes (Signed)
Follow Up Note: Gyn-Onc  [name] 69 y.o. female  CC:  Chief Complaint  Patient presents with  . Endometrial cancer [facility])    Follow Up    HPI:  [name] is a 69 year old who was initially seen in April 2012 as a referral from Dr. Kalman Shan for endometrial cancer identified on an endometrial biopsy in March 2012.  She underwent a TAH-BSO, pelvic and paraaortic lymphadenectomy on Jan 04, 2011. Final pathology showed a grade 3 endometrial cancer with superficial myometrial invasion (stage IA). No adjuvant therapy was recommended at that time. She has been without evidence of recurrence since that time.  Interval History:  She presents today for continued follow up.  No other concerns voiced since her last visit.  She states she has been healthy since the last time we saw her and she is taking COVID seriously. She is planning on preparing food and having her family pick it up to have contactless interactions during the holidays due to [place]. Tolerating diet with no nausea or emesis reported. Denies vaginal bleeding or change in bowel or bladder habits. Recent mammogram normal. She states she thought about obtaining a PCP after our last visit but has not called. She states she has had a history of fearing doctors. She would not like to pursue a referral to a PCP today but states she will think about it. No other concerns voiced.      Review of Systems  Constitutional: Feels well.  No fever, chills, early satiety, unintentional weight loss or gain, or fatigue.  Cardiovascular: No chest pain, shortness of breath, or edema.  Pulmonary: No cough or wheeze.  Gastrointestinal: No nausea, vomiting, or diarrhea. No bright red blood per rectum or change in bowel movement.  Genitourinary: No frequency, urgency, or dysuria. No vaginal bleeding or discharge.  Musculoskeletal: No myalgia or joint pain. Neurologic: No weakness, numbness, or change in gait.  Psychology: No depression, anxiety, or  insomnia.  Health Maintenance: Mammogram:  05/2020.  Pap Smear:02/05/16: normal. Not indicated as part of surveillance per [employer] guidelines Colonoscopy:  Up to date. Due in 2023 Lipid Panel: 2014  Current Meds:  Outpatient Encounter Medications as of 08/11/2020  Medication Sig  . Calcium Carbonate-Vit D-Min (CALCIUM 1200 PO) Take 1 tablet by mouth daily.  Marland Kitchen DANDELION PO Take by mouth.  . vitamin C (ASCORBIC ACID) 500 MG tablet Take 500 mg by mouth daily.   No facility-administered encounter medications on file as of 08/11/2020.    Allergy:  Allergies  Allergen Reactions  . Codeine Nausea Only  . Cytotec [Misoprostol] Nausea Only    Social Hx:   Social History   Socioeconomic History  . Marital status: Single    Spouse name: Not on file  . Number of children: Not on file  . Years of education: Not on file  . Highest education level: Not on file  Occupational History  . Not on file  Tobacco Use  . Smoking status: Never Smoker  . Smokeless tobacco: Never Used  Vaping Use  . Vaping Use: Never used  Substance and Sexual Activity  . Alcohol use: Yes    Comment: occasional  . Drug use: No  . Sexual activity: Not Currently  Other Topics Concern  . Not on file  Social History Narrative  . Not on file   Social Determinants of Health   Financial Resource Strain:   . Difficulty of Paying Living Expenses: Not on file  Food Insecurity:   .  Worried About Charity fundraiser in the Last Year: Not on file  . Ran Out of Food in the Last Year: Not on file  Transportation Needs:   . Lack of Transportation (Medical): Not on file  . Lack of Transportation (Non-Medical): Not on file  Physical Activity:   . Days of Exercise per Week: Not on file  . Minutes of Exercise per Session: Not on file  Stress:   . Feeling of Stress : Not on file  Social Connections:   . Frequency of Communication with Friends and Family: Not on file  . Frequency of Social Gatherings with Friends and  Family: Not on file  . Attends Religious Services: Not on file  . Active Member of Clubs or Organizations: Not on file  . Attends Archivist Meetings: Not on file  . Marital Status: Not on file  Intimate Partner Violence:   . Fear of Current or Ex-Partner: Not on file  . Emotionally Abused: Not on file  . Physically Abused: Not on file  . Sexually Abused: Not on file    Past Surgical Hx:  Past Surgical History:  Procedure Laterality Date  . ABDOMINAL HYSTERECTOMY  5/12   TAHBSO pelvic and paraaortic LND  . TOE SURGERY     Bil feet    Past Medical Hx:  Past Medical History:  Diagnosis Date  . Cancer ([place]) 5/12   gr 3 stg 1A  . Obesity     Family Hx:  Family History  Problem Relation Age of Onset  . Diabetes Father   . Lung cancer Sister   . Uterine cancer Sister     Vitals:  Blood pressure 138/64, pulse 79, temperature (!) 97.5 F (36.4 C), temperature source Tympanic, resp. rate 18, SpO2 100 %. Pt refused height and weight check today.  Physical Exam:  General: Well developed, well nourished female in no acute distress. Alert and oriented x 3.  Head/Neck: Sclerae anicteric.  Oropharynx clear with no lesions.  Supple without any enlargements.  Lymph node survey: No cervical, supraclavicular adenopathy.  Cardiovascular: Regular rate and rhythm. S1 and S2 normal.  Lungs: Clear to auscultation bilaterally. No wheezes/crackles/rhonchi noted.  Skin: No rashes or lesions present. Back: No CVA tenderness.  Abdomen: Abdomen soft, non-tender and obese. Active bowel sounds in all quadrants. No evidence of a fluid wave or abdominal masses. Midline incision with no evidence of herniation or masses.  Genitourinary:    Vulva/vagina: Normal external female genitalia. No lesions.    Urethra: No lesions or masses.    Vagina: Atrophic without any lesions. No palpable masses. No vaginal bleeding or drainage noted.   Rectal: Good tone, no masses, no cul de sac nodularity.   Extremities: No bilateral cyanosis, edema, or clubbing.   Assessment/Plan:   [name] is a 69 year old who underwent a TAH-BSO, pelvic and paraaortic lymphadenectomy on Jan 04, 2011 for a grade 3 endometrial cancer with superficial myometrial invasion (stage IA).  No adjuvant therapy was recommended.  No evidence of recurrence on today's examination. She is advised that she can follow up in one year or sooner if needed.  Reportable signs and symptoms reviewed. Advised her to call when she felt comfortable with moving forward with a referral to primary care. She is to call the office for any concerns/questions.      Dorothyann Gibbs, NP 08/11/2020, 12:03 PM

## 2020-08-11 NOTE — Patient Instructions (Signed)
You are doing great! Your exam today was normal. Plan to follow up in one year (Dec 2022) or sooner if needed. Please call for any new symptoms, concerns, or questions. Happy Holidays to you!

## 2020-11-20 DIAGNOSIS — H9193 Unspecified hearing loss, bilateral: Secondary | ICD-10-CM | POA: Diagnosis not present

## 2021-05-11 ENCOUNTER — Other Ambulatory Visit: Payer: Self-pay | Admitting: Gynecology

## 2021-05-11 DIAGNOSIS — Z1231 Encounter for screening mammogram for malignant neoplasm of breast: Secondary | ICD-10-CM

## 2021-06-16 ENCOUNTER — Ambulatory Visit
Admission: RE | Admit: 2021-06-16 | Discharge: 2021-06-16 | Disposition: A | Discharge status: Home / Self Care | Payer: Medicare HMO | Source: Ambulatory Visit | Attending: Gynecology | Admitting: Gynecology

## 2021-06-16 ENCOUNTER — Other Ambulatory Visit: Payer: Self-pay | Admitting: Gynecology

## 2021-06-16 ENCOUNTER — Other Ambulatory Visit: Payer: Self-pay

## 2021-06-16 DIAGNOSIS — Z1231 Encounter for screening mammogram for malignant neoplasm of breast: Secondary | ICD-10-CM

## 2021-07-13 ENCOUNTER — Telehealth: Payer: Self-pay

## 2021-07-13 NOTE — Telephone Encounter (Signed)
Following up with Ms. [name], patient inquiring if she needs to schedule a follow up exam. Per Joylene John, NP since patient is over 10 years from diagnosis we consider her cured of her endometrial cancer. No follow up is necessary but we are happy to see her every 1-2 if she would like. Still recommend a PCP to monitor general health (BP, cholesterol etc..). Patient verbalized understanding, she wishes Melissa well. Instructed to call for any needs.

## 2022-03-17 IMAGING — MG DIGITAL SCREENING BILAT W/ CAD
6 series · 6 of 6 positions shown · non-contrast
Comparison: Previous exam(s).

ACR Breast Density Category a: The breast tissue is almost entirely
fatty.

CLINICAL DATA: Screening.

EXAM:
DIGITAL SCREENING BILATERAL MAMMOGRAM WITH CAD

[L CC]
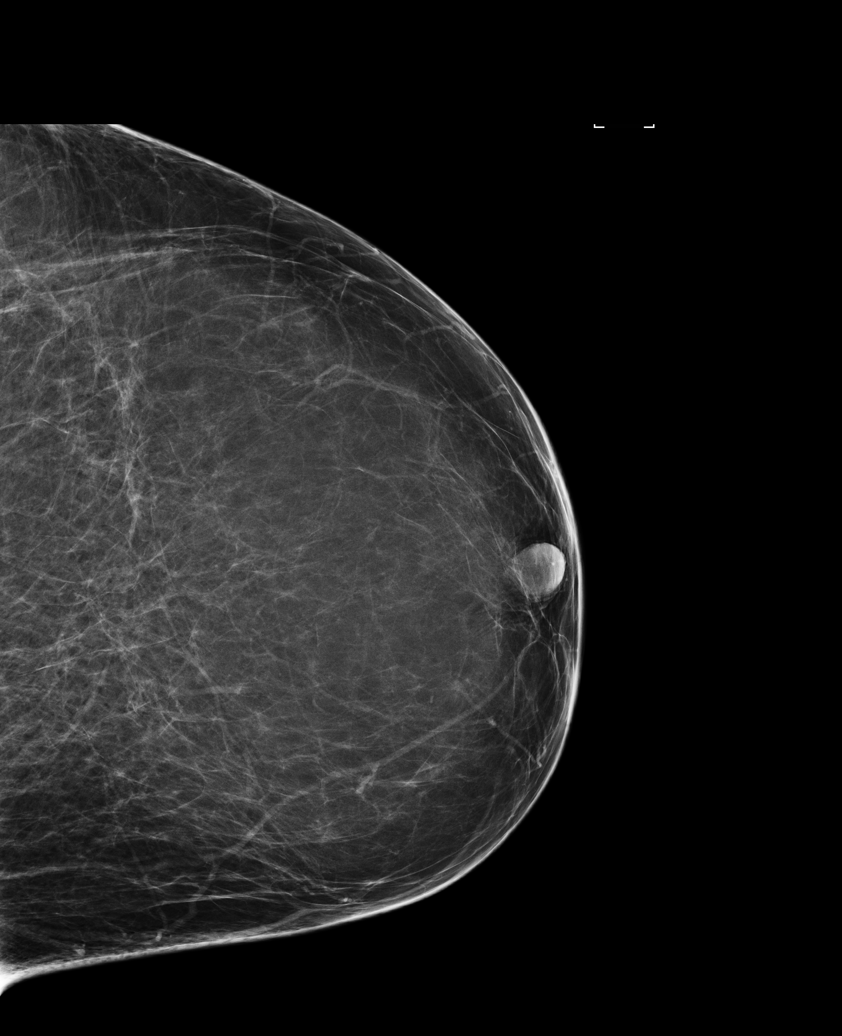

[R MLO (1 of 2)]
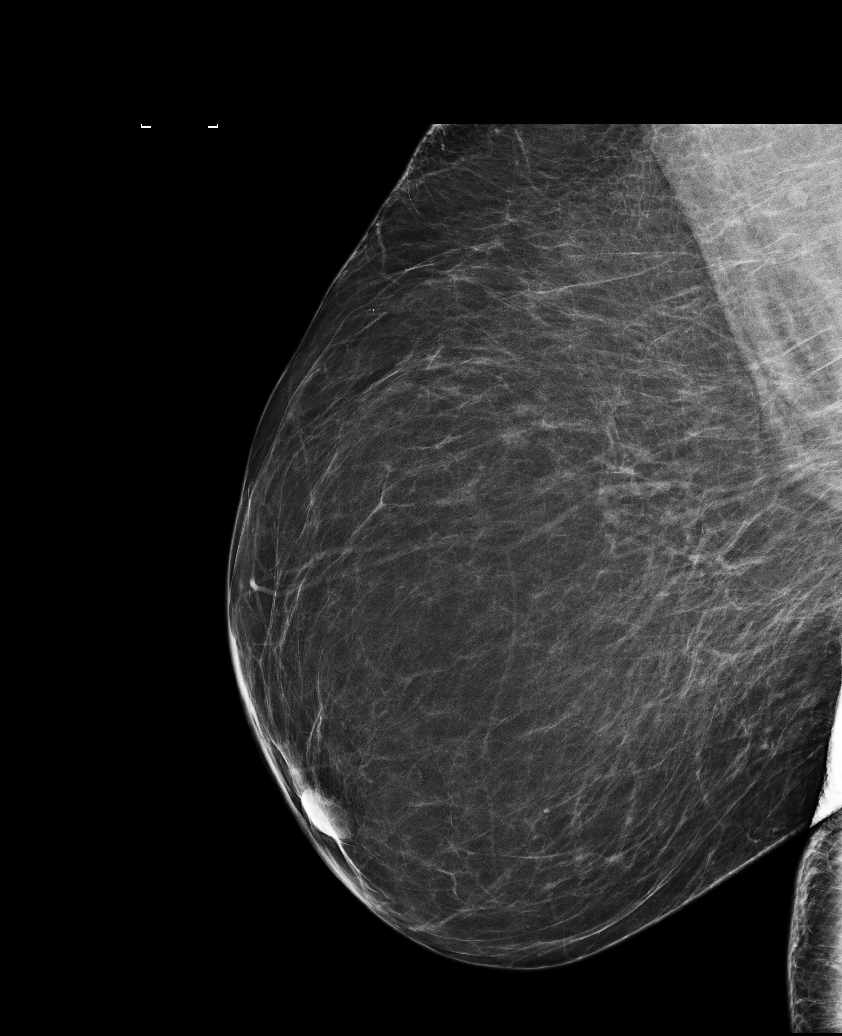

[L MLO (1 of 2)]
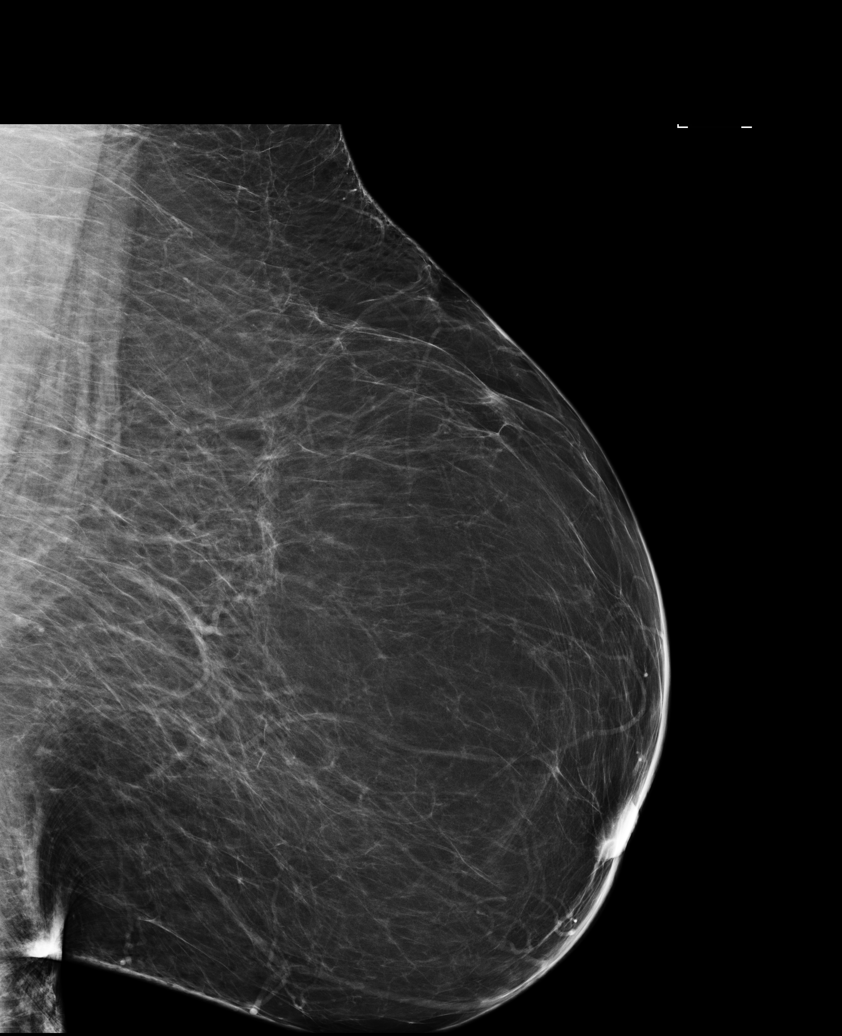

[R MLO (2 of 2)]
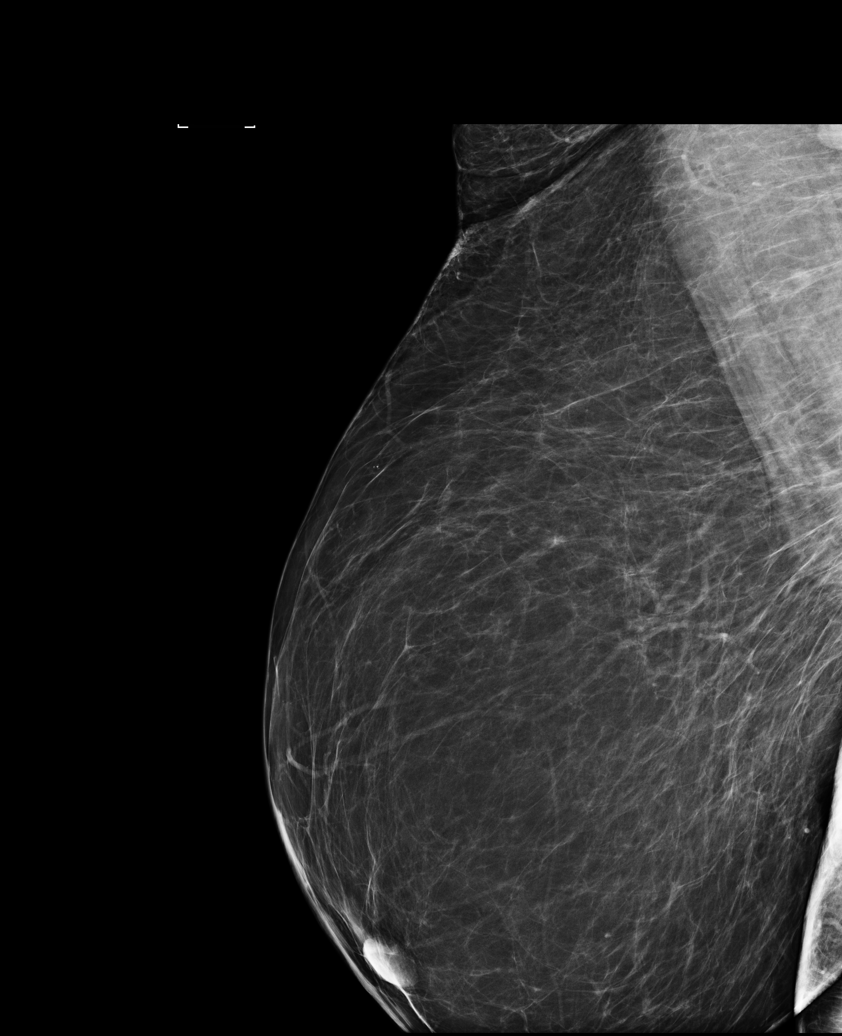

[L MLO (2 of 2)]
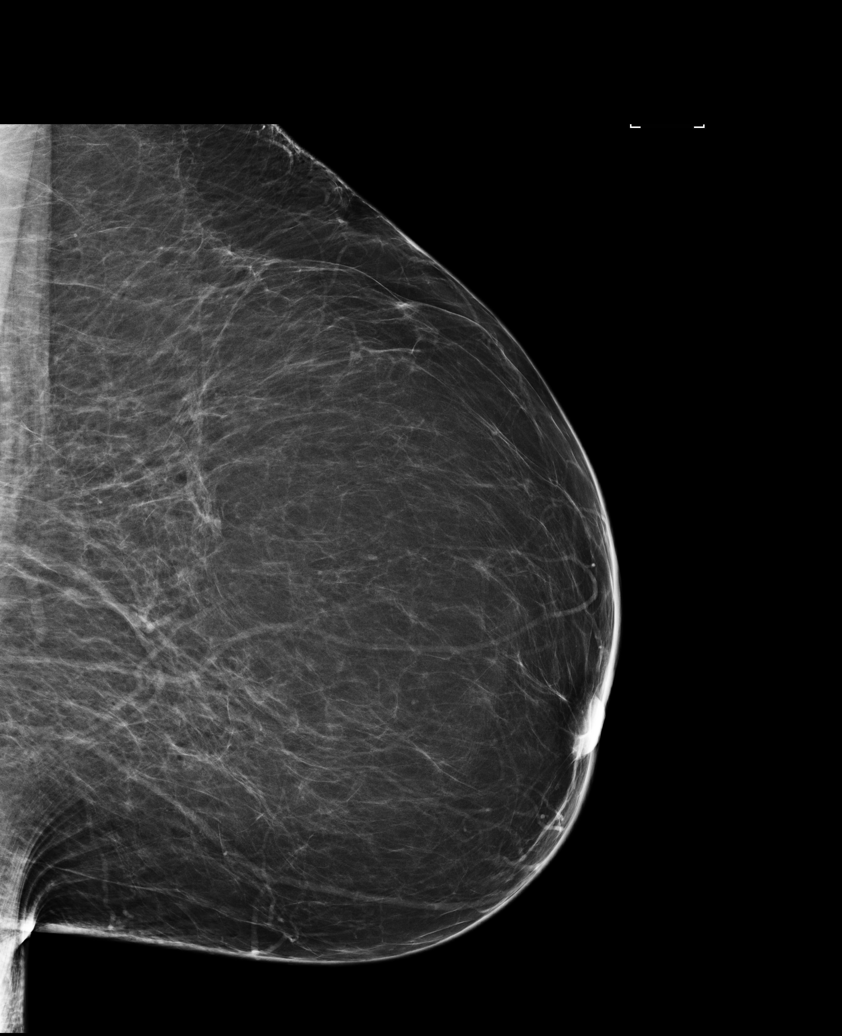

[R CC]
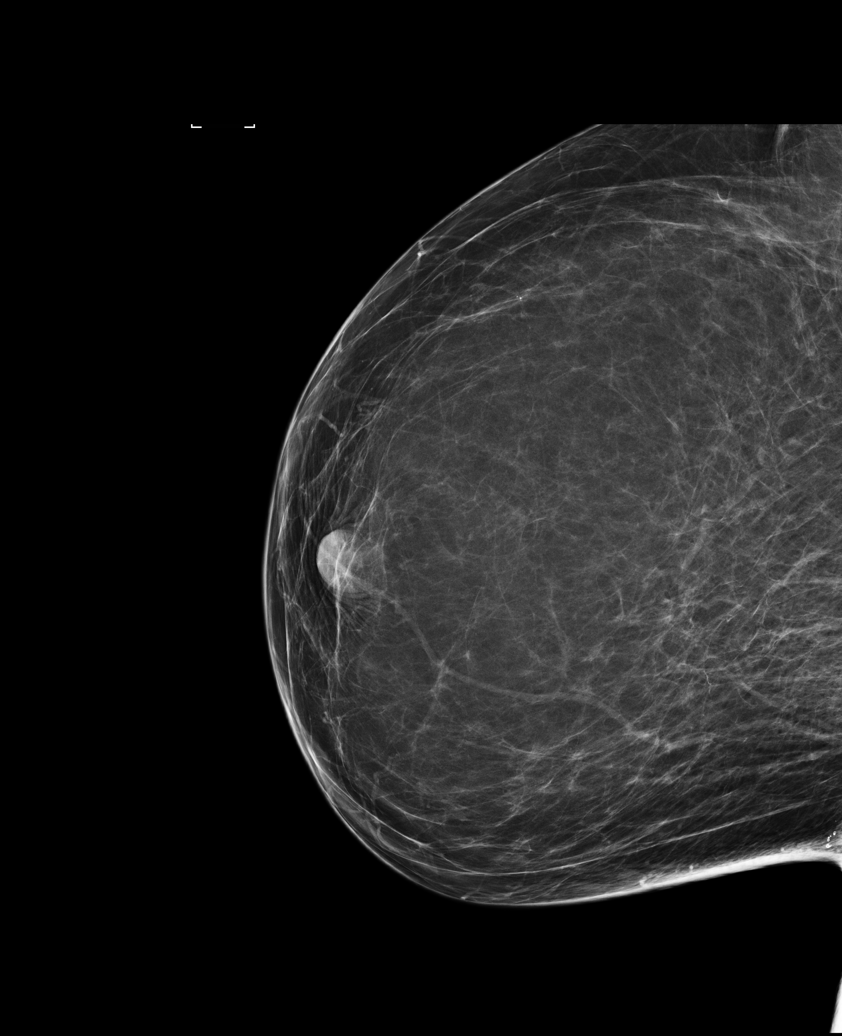

[6 of 6 positions shown; findings below may reference images not displayed]

FINDINGS: There are no findings suspicious for malignancy. Images were
processed with CAD.
IMPRESSION: No mammographic evidence of malignancy. A result letter of this
screening mammogram will be mailed directly to the patient.

RECOMMENDATION:
Screening mammogram in one year. (Code:[id number])

BI-RADS CATEGORY  1: Negative.

## 2023-03-17 DIAGNOSIS — R600 Localized edema: Secondary | ICD-10-CM | POA: Diagnosis not present

## 2023-03-17 DIAGNOSIS — R262 Difficulty in walking, not elsewhere classified: Secondary | ICD-10-CM | POA: Diagnosis not present

## 2023-03-17 DIAGNOSIS — M67972 Unspecified disorder of synovium and tendon, left ankle and foot: Secondary | ICD-10-CM | POA: Diagnosis not present

## 2023-03-17 DIAGNOSIS — S92002A Unspecified fracture of left calcaneus, initial encounter for closed fracture: Secondary | ICD-10-CM | POA: Diagnosis not present

## 2023-03-31 DIAGNOSIS — S92002D Unspecified fracture of left calcaneus, subsequent encounter for fracture with routine healing: Secondary | ICD-10-CM | POA: Diagnosis not present

## 2023-03-31 DIAGNOSIS — M67972 Unspecified disorder of synovium and tendon, left ankle and foot: Secondary | ICD-10-CM | POA: Diagnosis not present

## 2023-03-31 DIAGNOSIS — R262 Difficulty in walking, not elsewhere classified: Secondary | ICD-10-CM | POA: Diagnosis not present

## 2023-05-02 ENCOUNTER — Other Ambulatory Visit: Payer: Self-pay | Admitting: Nurse Practitioner

## 2023-05-02 DIAGNOSIS — E2839 Other primary ovarian failure: Secondary | ICD-10-CM
